# Patient Record
Sex: Male | Born: 1965 | Race: Black or African American | Hispanic: No | Marital: Married | Smoking: Current every day smoker
Health system: Southern US, Community
[De-identification: ages and names within clinical notes are randomized; demographics above are authoritative.]

## PROBLEM LIST (undated history)

## (undated) DIAGNOSIS — M1990 Unspecified osteoarthritis, unspecified site: Secondary | ICD-10-CM

## (undated) DIAGNOSIS — R7303 Prediabetes: Secondary | ICD-10-CM

## (undated) DIAGNOSIS — Z789 Other specified health status: Secondary | ICD-10-CM

## (undated) HISTORY — PX: TONSILLECTOMY: SUR1361

---

## 2018-04-09 DIAGNOSIS — Z6841 Body Mass Index (BMI) 40.0 and over, adult: Secondary | ICD-10-CM | POA: Diagnosis not present

## 2018-04-09 DIAGNOSIS — L03115 Cellulitis of right lower limb: Secondary | ICD-10-CM | POA: Diagnosis not present

## 2018-04-09 DIAGNOSIS — S81801A Unspecified open wound, right lower leg, initial encounter: Secondary | ICD-10-CM | POA: Diagnosis not present

## 2018-04-09 DIAGNOSIS — S91301A Unspecified open wound, right foot, initial encounter: Secondary | ICD-10-CM | POA: Diagnosis not present

## 2018-05-02 ENCOUNTER — Encounter: Payer: Self-pay | Admitting: Family

## 2018-05-02 ENCOUNTER — Ambulatory Visit (INDEPENDENT_AMBULATORY_CARE_PROVIDER_SITE_OTHER): Payer: BLUE CROSS/BLUE SHIELD | Admitting: Family

## 2018-05-02 VITALS — BP 140/99 | HR 78 | Temp 98.7°F | Ht 73.0 in | Wt 306.4 lb

## 2018-05-02 DIAGNOSIS — S81801D Unspecified open wound, right lower leg, subsequent encounter: Secondary | ICD-10-CM | POA: Diagnosis not present

## 2018-05-02 DIAGNOSIS — Z23 Encounter for immunization: Secondary | ICD-10-CM

## 2018-05-02 DIAGNOSIS — S81801A Unspecified open wound, right lower leg, initial encounter: Secondary | ICD-10-CM | POA: Diagnosis not present

## 2018-05-02 MED ORDER — DOXYCYCLINE HYCLATE 100 MG PO TABS: 100.0000 mg | ORAL_TABLET | Freq: Two times a day (BID) | ORAL | 0 refills | Status: DC

## 2018-05-02 NOTE — Progress Notes (Signed)
Subjective:    Patient ID: Mike Hodge, male    DOB: 10-28-1965, 52 y.o.   MRN: [id number]  Chief Complaint  Patient presents with  . New Patient (Initial Visit)    etabilish care, rechecklaceration on right leg   Pt presents to the office today to establish care. He states he had laceration about a month ago and would like this checked. He states he was walking up stairs and fell and hit the wood steps on his right lower leg. He went to the Urgent Care and was given Bactrim. He completed this medication. He states the wound is gradually improving with less swelling and pain. He cleans the leg daily with peroxide and states he a mild reddish/yellowish discharge.  Wound Check  He was originally treated more than 14 days ago. Previous treatment included oral antibiotics. His temperature was unmeasured prior to arrival. The redness has improved. The swelling has improved. He has no difficulty moving the affected extremity or digit.      Review of Systems  Skin: Positive for wound.  All other systems reviewed and are negative.  Breast Cancer-relatedfamily history is not on file.  Social History   Socioeconomic History  . Marital status: Married    Spouse name: Not on file  . Number of children: Not on file  . Years of education: Not on file  . Highest education level: Not on file  Occupational History  . Not on file  Social Needs  . Financial resource strain: Not on file  . Food insecurity:    Worry: Not on file    Inability: Not on file  . Transportation needs:    Medical: Not on file    Non-medical: Not on file  Tobacco Use  . Smoking status: Current Every Day Smoker    Types: Cigarettes  . Smokeless tobacco: Never Used  Substance and Sexual Activity  . Alcohol use: Yes    Alcohol/week: 5.0 standard drinks    Types: 5 Cans of beer per week    Comment: about 5 cans of beer weekly  . Drug use: Never  . Sexual activity: Yes  Lifestyle  . Physical activity:    Days  per week: Not on file    Minutes per session: Not on file  . Stress: Not on file  Relationships  . Social connections:    Talks on phone: Not on file    Gets together: Not on file    Attends religious service: Not on file    Active member of club or organization: Not on file    Attends meetings of clubs or organizations: Not on file    Relationship status: Not on file  Other Topics Concern  . Not on file  Social History Narrative  . Not on file       Objective:   Physical Exam  Constitutional: He is oriented to person, place, and time. He appears well-developed and well-nourished. No distress.  HENT:  Head: Normocephalic.  Right Ear: External ear normal.  Left Ear: External ear normal.  Mouth/Throat: Oropharynx is clear and moist.  Eyes: Pupils are equal, round, and reactive to light. Right eye exhibits no discharge. Left eye exhibits no discharge.  Neck: Normal range of motion. Neck supple. No thyromegaly present.  Cardiovascular: Normal rate, regular rhythm, normal heart sounds and intact distal pulses.  No murmur heard. Pulmonary/Chest: Effort normal and breath sounds normal. No respiratory distress. He has no wheezes.  Abdominal: Soft. Bowel sounds are  normal. He exhibits no distension. There is no tenderness.  Musculoskeletal: Normal range of motion. He exhibits no edema or tenderness.  Neurological: He is alert and oriented to person, place, and time. He has normal reflexes. No cranial nerve deficit.  Skin: Skin is warm and dry. No rash noted. No erythema.  2.5X1.8Cm and 0.5 cm deep  Psychiatric: He has a normal mood and affect. His behavior is normal. Judgment and thought content normal.  Vitals reviewed.     Wound cleaned, and wet to dry dressing applied.  BP (!) 140/99   Pulse 78   Temp 98.7 F (37.1 C) (Oral)   Ht 6\' 1"  (1.854 m)   Wt (!) 306 lb 6.4 oz (139 kg)   BMI 40.42 kg/m      Assessment & Plan:  Browning Southwood comes in today with chief complaint  of New Patient (Initial Visit) (etabilish care, rechecklaceration on right leg)   Diagnosis and orders addressed:  1. Open wound of right lower leg, subsequent encounter Will start Doxycycline After reviewing Urgent Care wound culture- Pt was suppose to continue Bactrim, but also start Omnicef. He reports he was not aware and never picked up rx.  Wound is still very deep and not healing. We will do referral to wound care.  Dressing change wet to dry daily RTO in 10 days for recheck if you do not have wound care appt set up - doxycycline (VIBRA-TABS) 100 MG tablet; Take 1 tablet (100 mg total) by mouth 2 (two) times daily.  Dispense: 20 tablet; Refill: 0 - Anaerobic and Aerobic Culture - AMB referral to wound care center     Jannifer Rodney, FNP

## 2018-05-02 NOTE — Patient Instructions (Signed)

## 2018-05-02 NOTE — Progress Notes (Signed)
a 

## 2018-05-06 LAB — ANAEROBIC AND AEROBIC CULTURE

## 2018-05-13 ENCOUNTER — Ambulatory Visit: Payer: BLUE CROSS/BLUE SHIELD | Admitting: Family

## 2018-05-17 ENCOUNTER — Ambulatory Visit (INDEPENDENT_AMBULATORY_CARE_PROVIDER_SITE_OTHER): Payer: BLUE CROSS/BLUE SHIELD | Admitting: Family

## 2018-05-17 ENCOUNTER — Encounter: Payer: Self-pay | Admitting: Family

## 2018-05-17 VITALS — BP 161/117 | HR 68 | Temp 97.2°F | Ht 73.0 in | Wt 307.6 lb

## 2018-05-17 DIAGNOSIS — Z5189 Encounter for other specified aftercare: Secondary | ICD-10-CM

## 2018-05-17 DIAGNOSIS — R030 Elevated blood-pressure reading, without diagnosis of hypertension: Secondary | ICD-10-CM

## 2018-05-17 NOTE — Patient Instructions (Signed)

## 2018-05-17 NOTE — Progress Notes (Signed)
   Subjective:    Patient ID: Mike Hodge, male    DOB: 11/09/1965, 52 y.o.   MRN: [id number]  Chief Complaint  Patient presents with  . Wound Check   PT presents to the office today for wound recheck. We saw each other on 05/02/18 for wound on right lower leg that he had fallen on some wooden steps around 09/01. He has completed his doxycycline and doing wet to dry dressing. He reports he feels like the wound has become smaller.   He has not followed up with wound care, because his mother has been in the hospital.   Wound Check  He was originally treated more than 14 days ago. Previous treatment included oral antibiotics. There has been no drainage from the wound. The redness has improved. The swelling has improved. There is no pain present. He has no difficulty moving the affected extremity or digit.      Review of Systems  All other systems reviewed and are negative.      Objective:   Physical Exam  Constitutional: He is oriented to person, place, and time. He appears well-developed and well-nourished. No distress.  HENT:  Head: Normocephalic.  Eyes: Pupils are equal, round, and reactive to light. Right eye exhibits no discharge. Left eye exhibits no discharge.  Neck: Normal range of motion. Neck supple. No thyromegaly present.  Cardiovascular: Normal rate, regular rhythm, normal heart sounds and intact distal pulses.  No murmur heard. Pulmonary/Chest: Effort normal and breath sounds normal. No respiratory distress. He has no wheezes.  Abdominal: Soft. Bowel sounds are normal. He exhibits no distension. There is no tenderness.  Musculoskeletal: Normal range of motion. He exhibits no edema or tenderness.  Neurological: He is alert and oriented to person, place, and time. He has normal reflexes. No cranial nerve deficit.  Skin: Skin is warm and dry. No rash noted. There is erythema.  Skin wound on right lower leg approx 1.5X1.4cm and 0.5 cm deep  Psychiatric: He has a normal  mood and affect. His behavior is normal. Judgment and thought content normal.  Vitals reviewed.       BP (!) 161/117   Pulse 68   Temp (!) 97.2 F (36.2 C) (Oral)   Ht 6\' 1"  (1.854 m)   Wt (!) 307 lb 9.6 oz (139.5 kg)   BMI 40.58 kg/m      Assessment & Plan:  [name] comes in today with chief complaint of Wound Check   Diagnosis and orders addressed:  1. Encounter for wound care Wound as improved in size and erythemas No s/s of infection at this time, will hold off on antibiotics at this time. Discussed importance of calling or RTO if wound has increased redness, swelling, fever, or discharge He will call wound care, number given to patient  2. Elevated blood pressure reading Last two visits his BP has been elevated He does not wish to start medications  Low salt diet RTO in 4 weeks to recheck and if still elevated will start medication  2  Follow up plan: 4 weeks    Jannifer Rodney, FNP

## 2018-06-14 ENCOUNTER — Ambulatory Visit: Payer: BLUE CROSS/BLUE SHIELD | Admitting: Family

## 2019-07-16 DIAGNOSIS — R05 Cough: Secondary | ICD-10-CM | POA: Diagnosis not present

## 2019-07-16 DIAGNOSIS — Z20828 Contact with and (suspected) exposure to other viral communicable diseases: Secondary | ICD-10-CM | POA: Diagnosis not present

## 2019-07-16 DIAGNOSIS — R0602 Shortness of breath: Secondary | ICD-10-CM | POA: Diagnosis not present

## 2021-01-13 DIAGNOSIS — M542 Cervicalgia: Secondary | ICD-10-CM | POA: Diagnosis not present

## 2021-01-14 ENCOUNTER — Other Ambulatory Visit: Payer: Self-pay | Admitting: Orthopedic Surgery

## 2021-01-14 DIAGNOSIS — M542 Cervicalgia: Secondary | ICD-10-CM

## 2021-01-18 ENCOUNTER — Ambulatory Visit
Admission: RE | Admit: 2021-01-18 | Discharge: 2021-01-18 | Disposition: A | Discharge status: Home / Self Care | Payer: BC Managed Care – PPO | Source: Ambulatory Visit | Attending: Orthopedic Surgery | Admitting: Orthopedic Surgery

## 2021-01-18 ENCOUNTER — Other Ambulatory Visit: Payer: Self-pay

## 2021-01-18 DIAGNOSIS — M4802 Spinal stenosis, cervical region: Secondary | ICD-10-CM | POA: Diagnosis not present

## 2021-01-18 DIAGNOSIS — M542 Cervicalgia: Secondary | ICD-10-CM

## 2021-01-20 DIAGNOSIS — G9520 Unspecified cord compression: Secondary | ICD-10-CM | POA: Diagnosis not present

## 2021-02-01 ENCOUNTER — Other Ambulatory Visit: Payer: Self-pay | Admitting: Orthopedic Surgery

## 2021-02-01 ENCOUNTER — Other Ambulatory Visit: Payer: Self-pay | Admitting: Neurosurgery

## 2021-02-01 DIAGNOSIS — M542 Cervicalgia: Secondary | ICD-10-CM

## 2021-02-11 NOTE — Pre-Procedure Instructions (Addendum)
Jaxtin Raimondo  02/11/2021      Your procedure is scheduled on Thursday, August 4.  Report to [facility], Main Entrance or Entrance "A" at 10:30 AM .                Your surgery or procedure is scheduled to begin at 12:30 AM   Call this number if you have problems the morning of surgery: [phone]  This is the number for the Pre- Surgical Desk.                For any other questions, please call [phone], Monday - Friday 8 AM - 4 PM.   Remember:  Do not eat after midnight Wednesday, 02/16/21  You may drink clear liquids until 9:30 AM Clear liquids are: Water, Tea, Coffee with no cream or no dairy creamer- you may use sugar or sweater, Soda, Gatorade, Jello without fruit, Plain Popsicle with fruit.    Take these medicines the morning of surgery with A SIP OF WATER : Tylenol if needed  STOP taking Aspirin, Aspirin Products (Goody Powder, Excedrin Migraine), Ibuprofen (Advil), Naproxen (Aleve), Vitamins and Herbal Products (ie Fish Oil).    Special instructions:    [facility]- Preparing For Surgery  Before surgery, you can play an important role. Because skin is not sterile, your skin needs to be as free of germs as possible. You can reduce the number of germs on your skin by washing with CHG (chlorahexidine gluconate) Soap before surgery.  CHG is an antiseptic cleaner which kills germs and bonds with the skin to continue killing germs even after washing.    Oral Hygiene is also important to reduce your risk of infection.  Remember - BRUSH YOUR TEETH THE MORNING OF SURGERY WITH YOUR REGULAR TOOTHPASTE  Please do not use if you have an allergy to CHG or antibacterial soaps. If your skin becomes reddened/irritated stop using the CHG.  Do not shave (including legs and underarms) for at least 48 hours prior to first CHG shower. It is OK to shave your face.  Please follow these instructions carefully.   Shower the NIGHT BEFORE SURGERY and the MORNING OF SURGERY with CHG.    If you chose to wash your hair, wash your hair first as usual with your normal shampoo.  After you shampoo, wash your face and private area with the soap you use at home, then rinse your hair and body thoroughly to remove the shampoo and soap.  Use CHG as you would any other liquid soap. You can apply CHG directly to the skin and wash gently with a scrungie or a clean washcloth.   Apply the CHG Soap to your body ONLY FROM THE NECK DOWN.  Do not use on open wounds or open sores. Avoid contact with your eyes, ears, mouth and genitals (private parts).   Wash thoroughly, paying special attention to the area where your surgery will be performed.  Thoroughly rinse your body with warm water from the neck down.  DO NOT shower/wash with your normal soap after using and rinsing off the CHG Soap.  Pat yourself dry with a CLEAN TOWEL.  Wear CLEAN PAJAMAS to bed the night before surgery, wear comfortable clothes the morning of surgery  Place CLEAN SHEETS on your bed the night of your first shower and DO NOT SLEEP WITH PETS.  Day of Surgery: Shower as instructed above. Do not apply any deodorants/lotions, powders or colognes.  Please wear clean clothes  to the hospital/surgery center.   Remember to brush your teeth WITH YOUR REGULAR TOOTHPASTE.  Do not wear jewelry, make-up or nail polish.  Do not shave 48 hours prior to surgery.  Men may shave face and neck.  Do not bring valuables to the hospital.  [facility] is not responsible for any belongings or valuables.  Contacts, dentures or bridgework may not be worn into surgery.  Leave your suitcase in the car.  After surgery it may be brought to your room.  For patients admitted to the hospital, discharge time will be determined by your treatment team.  Patients discharged the day of surgery will not be allowed to drive home.   Please read over the fact sheets that you were given.

## 2021-02-14 ENCOUNTER — Other Ambulatory Visit: Payer: Self-pay

## 2021-02-14 ENCOUNTER — Encounter (HOSPITAL_COMMUNITY)
Admission: RE | Admit: 2021-02-14 | Discharge: 2021-02-14 | Disposition: A | Discharge status: Home / Self Care | Payer: BC Managed Care – PPO | Source: Ambulatory Visit | Attending: Neurosurgery | Admitting: Neurosurgery

## 2021-02-14 ENCOUNTER — Encounter (HOSPITAL_COMMUNITY): Payer: Self-pay

## 2021-02-14 DIAGNOSIS — U071 COVID-19: Secondary | ICD-10-CM | POA: Insufficient documentation

## 2021-02-14 DIAGNOSIS — Z01812 Encounter for preprocedural laboratory examination: Secondary | ICD-10-CM | POA: Diagnosis not present

## 2021-02-14 HISTORY — DX: Other specified health status: Z78.9

## 2021-02-14 LAB — CBC
HCT: 51.9 % (ref 39.0–52.0)
Hemoglobin: 17.1 g/dL — ABNORMAL HIGH (ref 13.0–17.0)
MCH: 29.9 pg (ref 26.0–34.0)
MCHC: 32.9 g/dL (ref 30.0–36.0)
MCV: 90.7 fL (ref 80.0–100.0)
Platelets: 240 10*3/uL (ref 150–400)
RBC: 5.72 MIL/uL (ref 4.22–5.81)
RDW: 12 % (ref 11.5–15.5)
WBC: 6.4 10*3/uL (ref 4.0–10.5)
nRBC: 0 % (ref 0.0–0.2)

## 2021-02-14 LAB — SURGICAL PCR SCREEN
MRSA, PCR: NEGATIVE
Staphylococcus aureus: NEGATIVE

## 2021-02-14 LAB — BASIC METABOLIC PANEL
Anion gap: 8 (ref 5–15)
BUN: 12 mg/dL (ref 6–20)
CO2: 25 mmol/L (ref 22–32)
Calcium: 9.1 mg/dL (ref 8.9–10.3)
Chloride: 105 mmol/L (ref 98–111)
Creatinine, Ser: 0.94 mg/dL (ref 0.61–1.24)
GFR, Estimated: >60 mL/min (ref >60)
Glucose, Bld: 145 mg/dL — ABNORMAL HIGH (ref 70–99)
Potassium: 4.2 mmol/L (ref 3.5–5.1)
Sodium: 138 mmol/L (ref 135–145)

## 2021-02-14 LAB — TYPE AND SCREEN
ABO/RH(D): O POS
Antibody Screen: NEGATIVE

## 2021-02-14 LAB — SARS CORONAVIRUS 2 (TAT 6-24 HRS): SARS Coronavirus 2: POSITIVE — AB

## 2021-02-14 NOTE — Progress Notes (Addendum)
PCP - Has been seen at [facility]- Has a physical for DOT yearly.  Cardiologist - no   Chest x-ray - na  EKG - 02/14/21  Stress Test - no  ECHO - none  Cardiac Cath - none   Sleep Study - yes, yearly at [employer] CPAP - no  LABS- CBC, BMP, T/S, PCR, Covid  ASA-no  ERAS-yes  HA1C-na Fasting Blood Sugar - NA Checks Blood Sugar __0___ times a day Mike Hodge was positive for Covid. I left a message on Mike Hodge's voice mail with positive Covid result. I called Mike Hodge and notified him of positive Covid test. Anesthesia-  Pt denies having chest pain, sob, or fever at this time. All instructions explained to the pt, with a verbal understanding of the material. Pt agrees to go over the instructions while at home for a better understanding. Pt also instructed to self quarantine after being tested for COVID-19. The opportunity to ask questions was provided.

## 2021-03-03 ENCOUNTER — Other Ambulatory Visit: Payer: Self-pay

## 2021-03-03 ENCOUNTER — Encounter (HOSPITAL_COMMUNITY): Payer: Self-pay | Admitting: Neurosurgery

## 2021-03-03 NOTE — Progress Notes (Signed)
Spoke with pt about surgery change to 03/04/2021 at 1024. Pt needs to be here at 0800. NPO after midnight clear liquids until 0730 am on 03/04/2021. Pt to follow shower instructions with soap on instruction sheet from 02/14/21.    Pt was covid positive on 02/14/2021

## 2021-03-04 ENCOUNTER — Ambulatory Visit (HOSPITAL_COMMUNITY): Payer: BC Managed Care – PPO

## 2021-03-04 ENCOUNTER — Ambulatory Visit (HOSPITAL_COMMUNITY): Payer: BC Managed Care – PPO | Admitting: Certified Registered Nurse Anesthetist

## 2021-03-04 ENCOUNTER — Encounter (HOSPITAL_COMMUNITY): Payer: Self-pay | Admitting: Neurosurgery

## 2021-03-04 ENCOUNTER — Other Ambulatory Visit: Payer: Self-pay

## 2021-03-04 ENCOUNTER — Observation Stay (HOSPITAL_COMMUNITY)
Admission: RE | Admit: 2021-03-04 | Discharge: 2021-03-05 | Disposition: A | Discharge status: Home / Self Care | Payer: BC Managed Care – PPO | Attending: Neurosurgery | Admitting: Neurosurgery

## 2021-03-04 ENCOUNTER — Encounter (HOSPITAL_COMMUNITY)
Admission: RE | Disposition: A | Discharge status: Home / Self Care | Payer: Self-pay | Source: Home / Self Care | Attending: Neurosurgery

## 2021-03-04 ENCOUNTER — Ambulatory Visit (HOSPITAL_COMMUNITY): Payer: BC Managed Care – PPO | Admitting: Physician Assistant

## 2021-03-04 DIAGNOSIS — R200 Anesthesia of skin: Secondary | ICD-10-CM | POA: Diagnosis not present

## 2021-03-04 DIAGNOSIS — G9520 Unspecified cord compression: Secondary | ICD-10-CM | POA: Diagnosis not present

## 2021-03-04 DIAGNOSIS — G9529 Other cord compression: Principal | ICD-10-CM | POA: Insufficient documentation

## 2021-03-04 DIAGNOSIS — Z981 Arthrodesis status: Secondary | ICD-10-CM | POA: Diagnosis not present

## 2021-03-04 DIAGNOSIS — M4802 Spinal stenosis, cervical region: Secondary | ICD-10-CM | POA: Diagnosis not present

## 2021-03-04 DIAGNOSIS — F17200 Nicotine dependence, unspecified, uncomplicated: Secondary | ICD-10-CM | POA: Diagnosis not present

## 2021-03-04 DIAGNOSIS — Z419 Encounter for procedure for purposes other than remedying health state, unspecified: Secondary | ICD-10-CM

## 2021-03-04 HISTORY — PX: ANTERIOR CERVICAL CORPECTOMY: SHX1159

## 2021-03-04 LAB — TYPE AND SCREEN
ABO/RH(D): O POS
Antibody Screen: NEGATIVE

## 2021-03-04 SURGERY — ANTERIOR CERVICAL CORPECTOMY
Anesthesia: General

## 2021-03-04 MED ORDER — HYDROMORPHONE HCL 1 MG/ML IJ SOLN
0.2500 mg | INTRAMUSCULAR | Status: DC | PRN
  Administered 2021-03-04 (×2): 0.5 mg via INTRAVENOUS

## 2021-03-04 MED ORDER — LACTATED RINGERS IV SOLN: INTRAVENOUS | Status: DC

## 2021-03-04 MED ORDER — CHLORHEXIDINE GLUCONATE CLOTH 2 % EX PADS: 6.0000 | MEDICATED_PAD | Freq: Once | CUTANEOUS | Status: DC

## 2021-03-04 MED ORDER — ROCURONIUM BROMIDE 10 MG/ML (PF) SYRINGE
PREFILLED_SYRINGE | INTRAVENOUS | Status: DC | PRN
  Administered 2021-03-04: 50 mg via INTRAVENOUS
  Administered 2021-03-04: 30 mg via INTRAVENOUS
  Administered 2021-03-04: 70 mg via INTRAVENOUS
  Administered 2021-03-04: 20 mg via INTRAVENOUS

## 2021-03-04 MED ORDER — ONDANSETRON HCL 4 MG/2ML IJ SOLN: 4.0000 mg | Freq: Four times a day (QID) | INTRAMUSCULAR | Status: DC | PRN

## 2021-03-04 MED ORDER — HYDROCODONE-ACETAMINOPHEN 7.5-325 MG PO TABS
2.0000 | ORAL_TABLET | ORAL | Status: DC | PRN
  Filled 2021-03-04: qty 2

## 2021-03-04 MED ORDER — LIDOCAINE 2% (20 MG/ML) 5 ML SYRINGE
INTRAMUSCULAR | Status: DC | PRN
  Administered 2021-03-04: 60 mg via INTRAVENOUS

## 2021-03-04 MED ORDER — POTASSIUM CHLORIDE IN NACL 20-0.9 MEQ/L-% IV SOLN: INTRAVENOUS | Status: DC

## 2021-03-04 MED ORDER — SODIUM CHLORIDE 0.9% FLUSH: 3.0000 mL | Freq: Two times a day (BID) | INTRAVENOUS | Status: DC

## 2021-03-04 MED ORDER — SODIUM CHLORIDE 0.9 % IV SOLN
250.0000 mL | INTRAVENOUS | Status: DC
  Administered 2021-03-04: 250 mL via INTRAVENOUS

## 2021-03-04 MED ORDER — MIDAZOLAM HCL 2 MG/2ML IJ SOLN
INTRAMUSCULAR | Status: DC | PRN
  Administered 2021-03-04: 2 mg via INTRAVENOUS

## 2021-03-04 MED ORDER — ORAL CARE MOUTH RINSE: 15.0000 mL | Freq: Once | OROMUCOSAL | Status: AC

## 2021-03-04 MED ORDER — FENTANYL CITRATE (PF) 250 MCG/5ML IJ SOLN
INTRAMUSCULAR | Status: DC | PRN
  Administered 2021-03-04 (×3): 50 ug via INTRAVENOUS
  Administered 2021-03-04: 100 ug via INTRAVENOUS

## 2021-03-04 MED ORDER — ACETAMINOPHEN 650 MG RE SUPP: 650.0000 mg | RECTAL | Status: DC | PRN

## 2021-03-04 MED ORDER — DEXAMETHASONE SODIUM PHOSPHATE 10 MG/ML IJ SOLN
INTRAMUSCULAR | Status: DC | PRN
  Administered 2021-03-04: 10 mg via INTRAVENOUS

## 2021-03-04 MED ORDER — THROMBIN 20000 UNITS EX KIT
PACK | CUTANEOUS | Status: DC | PRN
  Administered 2021-03-04: 20 mL via TOPICAL

## 2021-03-04 MED ORDER — MIDAZOLAM HCL 2 MG/2ML IJ SOLN
INTRAMUSCULAR | Status: AC
  Filled 2021-03-04: qty 2

## 2021-03-04 MED ORDER — CEFAZOLIN IN SODIUM CHLORIDE 3-0.9 GM/100ML-% IV SOLN
3.0000 g | INTRAVENOUS | Status: AC
Start: 2021-03-04 — End: 2021-03-04
  Administered 2021-03-04: 3 g via INTRAVENOUS
  Filled 2021-03-04: qty 100

## 2021-03-04 MED ORDER — PROPOFOL 10 MG/ML IV BOLUS
INTRAVENOUS | Status: AC
  Filled 2021-03-04: qty 20

## 2021-03-04 MED ORDER — PHENOL 1.4 % MT LIQD: 1.0000 | OROMUCOSAL | Status: DC | PRN

## 2021-03-04 MED ORDER — CHLORHEXIDINE GLUCONATE 0.12 % MT SOLN
15.0000 mL | Freq: Once | OROMUCOSAL | Status: AC
  Administered 2021-03-04: 15 mL via OROMUCOSAL
  Filled 2021-03-04: qty 15

## 2021-03-04 MED ORDER — DEXAMETHASONE SODIUM PHOSPHATE 4 MG/ML IJ SOLN
4.0000 mg | Freq: Four times a day (QID) | INTRAMUSCULAR | Status: DC
  Administered 2021-03-04 – 2021-03-05 (×3): 4 mg via INTRAVENOUS
  Filled 2021-03-04 (×3): qty 1

## 2021-03-04 MED ORDER — MENTHOL 3 MG MT LOZG: 1.0000 | LOZENGE | OROMUCOSAL | Status: DC | PRN

## 2021-03-04 MED ORDER — SODIUM CHLORIDE 0.9% FLUSH: 3.0000 mL | INTRAVENOUS | Status: DC | PRN

## 2021-03-04 MED ORDER — ACETAMINOPHEN 325 MG PO TABS
650.0000 mg | ORAL_TABLET | ORAL | Status: DC | PRN
  Administered 2021-03-04: 650 mg via ORAL
  Filled 2021-03-04: qty 2

## 2021-03-04 MED ORDER — MORPHINE SULFATE (PF) 2 MG/ML IV SOLN: 2.0000 mg | INTRAVENOUS | Status: DC | PRN

## 2021-03-04 MED ORDER — ALBUMIN HUMAN 5 % IV SOLN: INTRAVENOUS | Status: DC | PRN

## 2021-03-04 MED ORDER — CELECOXIB 200 MG PO CAPS
200.0000 mg | ORAL_CAPSULE | Freq: Two times a day (BID) | ORAL | Status: DC
  Administered 2021-03-04: 200 mg via ORAL
  Filled 2021-03-04: qty 1

## 2021-03-04 MED ORDER — OXYCODONE HCL ER 10 MG PO T12A
10.0000 mg | EXTENDED_RELEASE_TABLET | Freq: Two times a day (BID) | ORAL | Status: DC
Start: 2021-03-04 — End: 2021-03-05
  Filled 2021-03-04: qty 1

## 2021-03-04 MED ORDER — FENTANYL CITRATE (PF) 250 MCG/5ML IJ SOLN
INTRAMUSCULAR | Status: AC
  Filled 2021-03-04: qty 5

## 2021-03-04 MED ORDER — DIAZEPAM 5 MG PO TABS: 5.0000 mg | ORAL_TABLET | Freq: Four times a day (QID) | ORAL | Status: DC | PRN

## 2021-03-04 MED ORDER — HYDROMORPHONE HCL 1 MG/ML IJ SOLN
INTRAMUSCULAR | Status: AC
  Filled 2021-03-04: qty 1

## 2021-03-04 MED ORDER — PROPOFOL 10 MG/ML IV BOLUS
INTRAVENOUS | Status: DC | PRN
  Administered 2021-03-04: 50 mg via INTRAVENOUS
  Administered 2021-03-04: 150 mg via INTRAVENOUS

## 2021-03-04 MED ORDER — DEXAMETHASONE 4 MG PO TABS: 4.0000 mg | ORAL_TABLET | Freq: Four times a day (QID) | ORAL | Status: DC

## 2021-03-04 MED ORDER — THROMBIN 20000 UNITS EX SOLR
CUTANEOUS | Status: AC
  Filled 2021-03-04: qty 20000

## 2021-03-04 MED ORDER — ACETAMINOPHEN 500 MG PO TABS
1000.0000 mg | ORAL_TABLET | Freq: Once | ORAL | Status: AC
  Administered 2021-03-04: 1000 mg via ORAL
  Filled 2021-03-04: qty 2

## 2021-03-04 MED ORDER — HYDROCODONE-ACETAMINOPHEN 7.5-325 MG PO TABS: 1.0000 | ORAL_TABLET | ORAL | Status: DC | PRN

## 2021-03-04 MED ORDER — PHENYLEPHRINE HCL-NACL 20-0.9 MG/250ML-% IV SOLN
INTRAVENOUS | Status: DC | PRN
  Administered 2021-03-04: 20 ug/min via INTRAVENOUS

## 2021-03-04 MED ORDER — LIDOCAINE-EPINEPHRINE 1 %-1:100000 IJ SOLN
INTRAMUSCULAR | Status: DC | PRN
  Administered 2021-03-04: 5 mL

## 2021-03-04 MED ORDER — SUGAMMADEX SODIUM 200 MG/2ML IV SOLN
INTRAVENOUS | Status: DC | PRN
  Administered 2021-03-04: 400 mg via INTRAVENOUS

## 2021-03-04 MED ORDER — ZOLPIDEM TARTRATE 5 MG PO TABS: 5.0000 mg | ORAL_TABLET | Freq: Every evening | ORAL | Status: DC | PRN

## 2021-03-04 MED ORDER — LIDOCAINE-EPINEPHRINE 1 %-1:100000 IJ SOLN
INTRAMUSCULAR | Status: AC
  Filled 2021-03-04: qty 1

## 2021-03-04 MED ORDER — ONDANSETRON HCL 4 MG/2ML IJ SOLN
INTRAMUSCULAR | Status: DC | PRN
  Administered 2021-03-04: 4 mg via INTRAVENOUS

## 2021-03-04 MED ORDER — 0.9 % SODIUM CHLORIDE (POUR BTL) OPTIME
TOPICAL | Status: DC | PRN
  Administered 2021-03-04: 1000 mL

## 2021-03-04 MED ORDER — ONDANSETRON HCL 4 MG PO TABS: 4.0000 mg | ORAL_TABLET | Freq: Four times a day (QID) | ORAL | Status: DC | PRN

## 2021-03-04 SURGICAL SUPPLY — 55 items
BAG COUNTER SPONGE SURGICOUNT (BAG) ×4 IMPLANT
BAND RUBBER #18 3X1/16 STRL (MISCELLANEOUS) ×4 IMPLANT
BASKET BONE COLLECTION (BASKET) ×2 IMPLANT
BLADE CLIPPER SURG (BLADE) IMPLANT
BUR DRUM 4.0 (BURR) ×2 IMPLANT
BUR MATCHSTICK NEURO 3.0 LAGG (BURR) ×2 IMPLANT
BUR ROUND FLUTED 4 SOFT TCH (BURR) ×2 IMPLANT
CAGE CORP CAPRI 12X14X24 7D (Cage) IMPLANT
CAGE CORP CAPRI 12X14X26 7D (Cage) ×2 IMPLANT
CANISTER SUCT 3000ML PPV (MISCELLANEOUS) ×2 IMPLANT
CARTRIDGE OIL MAESTRO DRILL (MISCELLANEOUS) ×1 IMPLANT
DECANTER SPIKE VIAL GLASS SM (MISCELLANEOUS) ×2 IMPLANT
DERMABOND ADVANCED (GAUZE/BANDAGES/DRESSINGS) ×1
DERMABOND ADVANCED .7 DNX12 (GAUZE/BANDAGES/DRESSINGS) ×1 IMPLANT
DIFFUSER DRILL AIR PNEUMATIC (MISCELLANEOUS) ×2 IMPLANT
DRAPE HALF SHEET 40X57 (DRAPES) IMPLANT
DRAPE LAPAROTOMY 100X72 PEDS (DRAPES) ×2 IMPLANT
DRAPE MICROSCOPE LEICA (MISCELLANEOUS) ×2 IMPLANT
DRILL BIT 14MM (BIT) ×2 IMPLANT
DURAPREP 6ML APPLICATOR 50/CS (WOUND CARE) ×2 IMPLANT
ELECT COATED BLADE 2.86 ST (ELECTRODE) ×2 IMPLANT
ELECT REM PT RETURN 9FT ADLT (ELECTROSURGICAL) ×2
ELECTRODE REM PT RTRN 9FT ADLT (ELECTROSURGICAL) ×1 IMPLANT
GAUZE 4X4 16PLY ~~LOC~~+RFID DBL (SPONGE) ×2 IMPLANT
GLOVE EXAM NITRILE XL STR (GLOVE) IMPLANT
GLOVE SURG LTX SZ6.5 (GLOVE) ×2 IMPLANT
GLOVE SURG LTX SZ7 (GLOVE) ×2 IMPLANT
GLOVE SURG UNDER POLY LF SZ7 (GLOVE) ×4 IMPLANT
GLOVE SURG UNDER POLY LF SZ7.5 (GLOVE) ×6 IMPLANT
GOWN STRL REUS W/ TWL LRG LVL3 (GOWN DISPOSABLE) ×2 IMPLANT
GOWN STRL REUS W/ TWL XL LVL3 (GOWN DISPOSABLE) ×2 IMPLANT
GOWN STRL REUS W/TWL 2XL LVL3 (GOWN DISPOSABLE) IMPLANT
GOWN STRL REUS W/TWL LRG LVL3 (GOWN DISPOSABLE) ×2
GOWN STRL REUS W/TWL XL LVL3 (GOWN DISPOSABLE) ×2
HALTER HD/CHIN CERV TRACTION D (MISCELLANEOUS) IMPLANT
HEMOSTAT SURGICEL 2X14 (HEMOSTASIS) IMPLANT
KIT BASIN OR (CUSTOM PROCEDURE TRAY) ×2 IMPLANT
KIT TURNOVER KIT B (KITS) ×2 IMPLANT
NEEDLE HYPO 25X1 1.5 SAFETY (NEEDLE) ×2 IMPLANT
NEEDLE SPNL 22GX3.5 QUINCKE BK (NEEDLE) ×6 IMPLANT
NS IRRIG 1000ML POUR BTL (IV SOLUTION) ×2 IMPLANT
OIL CARTRIDGE MAESTRO DRILL (MISCELLANEOUS) ×2
PACK LAMINECTOMY NEURO (CUSTOM PROCEDURE TRAY) ×2 IMPLANT
PAD ARMBOARD 7.5X6 YLW CONV (MISCELLANEOUS) ×2 IMPLANT
PIN DISTRACTION 14MM (PIN) ×2 IMPLANT
PLATE T HELIX 42 2L (Plate) ×2 IMPLANT
SCREW HELIX T SELF TAP 15MM (Screw) ×8 IMPLANT
SPONGE INTESTINAL PEANUT (DISPOSABLE) ×2 IMPLANT
SPONGE SURGIFOAM ABS GEL 100 (HEMOSTASIS) ×2 IMPLANT
SUT VIC AB 0 CT1 27 (SUTURE) ×1
SUT VIC AB 0 CT1 27XBRD ANTBC (SUTURE) ×1 IMPLANT
SUT VIC AB 3-0 SH 8-18 (SUTURE) ×6 IMPLANT
TOWEL GREEN STERILE (TOWEL DISPOSABLE) ×2 IMPLANT
TOWEL GREEN STERILE FF (TOWEL DISPOSABLE) ×2 IMPLANT
WATER STERILE IRR 1000ML POUR (IV SOLUTION) ×2 IMPLANT

## 2021-03-04 NOTE — Anesthesia Procedure Notes (Signed)
Procedure Name: Intubation Date/Time: 03/04/2021 12:10 PM Performed by: Lelon Perla, CRNA Pre-anesthesia Checklist: Patient identified, Emergency Drugs available, Suction available and Patient being monitored Patient Re-evaluated:Patient Re-evaluated prior to induction Oxygen Delivery Method: Circle system utilized Preoxygenation: Pre-oxygenation with 100% oxygen Induction Type: IV induction Ventilation: Mask ventilation without difficulty and Oral airway inserted - appropriate to patient size Laryngoscope Size: Glidescope and 4 Grade View: Grade I Tube type: Oral Tube size: 7.5 mm Number of attempts: 1 Airway Equipment and Method: Stylet and Oral airway Placement Confirmation: ETT inserted through vocal cords under direct vision, positive ETCO2 and breath sounds checked- equal and bilateral Secured at: 22 cm Tube secured with: Tape Dental Injury: Teeth and Oropharynx as per pre-operative assessment

## 2021-03-04 NOTE — Anesthesia Postprocedure Evaluation (Signed)
Anesthesia Post Note  Patient: Chancellor Vanderloop  Procedure(s) Performed: Cervical five Corpectomy     Patient location during evaluation: PACU Anesthesia Type: General Level of consciousness: awake and alert Pain management: pain level controlled Vital Signs Assessment: post-procedure vital signs reviewed and stable Respiratory status: spontaneous breathing, nonlabored ventilation, respiratory function stable and patient connected to nasal cannula oxygen Cardiovascular status: blood pressure returned to baseline and stable Postop Assessment: no apparent nausea or vomiting Anesthetic complications: no   No notable events documented.  Last Vitals:  Vitals:   03/04/21 1539 03/04/21 1554  BP: (!) 142/90 (!) 140/101  Pulse: 82 77  Resp: 16 (!) 25  Temp:    SpO2: 95% 96%    Last Pain:  Vitals:   03/04/21 1524  TempSrc:   PainSc: 10-Worst pain ever                 Lawan Nanez,W. EDMOND

## 2021-03-04 NOTE — Anesthesia Preprocedure Evaluation (Addendum)
Anesthesia Evaluation  Patient identified by MRN, date of birth, ID band Patient awake    Reviewed: Allergy & Precautions, H&P , NPO status , Patient's Chart, lab work & pertinent test results  Airway Mallampati: II  TM Distance: >3 FB Neck ROM: Full    Dental no notable dental hx. (+) Poor Dentition, Dental Advisory Given   Pulmonary Current Smoker and Patient abstained from smoking.,    Pulmonary exam normal breath sounds clear to auscultation       Cardiovascular negative cardio ROS   Rhythm:Regular Rate:Normal     Neuro/Psych negative neurological ROS  negative psych ROS   GI/Hepatic negative GI ROS, Neg liver ROS,   Endo/Other  Morbid obesity  Renal/GU negative Renal ROS  negative genitourinary   Musculoskeletal   Abdominal   Peds  Hematology negative hematology ROS (+)   Anesthesia Other Findings   Reproductive/Obstetrics negative OB ROS                            Anesthesia Physical Anesthesia Plan  ASA: 2  Anesthesia Plan: General   Post-op Pain Management:    Induction: Intravenous  PONV Risk Score and Plan: 2 and Ondansetron, Dexamethasone and Midazolam  Airway Management Planned: Oral ETT and Video Laryngoscope Planned  Additional Equipment:   Intra-op Plan:   Post-operative Plan: Extubation in OR  Informed Consent: I have reviewed the patients History and Physical, chart, labs and discussed the procedure including the risks, benefits and alternatives for the proposed anesthesia with the patient or authorized representative who has indicated his/her understanding and acceptance.     Dental advisory given  Plan Discussed with: CRNA  Anesthesia Plan Comments:         Anesthesia Quick Evaluation

## 2021-03-04 NOTE — Transfer of Care (Signed)
Immediate Anesthesia Transfer of Care Note  Patient: Mike Hodge  Procedure(s) Performed: Cervical five Corpectomy  Patient Location: PACU  Anesthesia Type:General  Level of Consciousness: awake, alert  and oriented  Airway & Oxygen Therapy: Patient Spontanous Breathing and Patient connected to face mask oxygen  Post-op Assessment: Report given to RN and Post -op Vital signs reviewed and stable  Post vital signs: Reviewed and stable  Last Vitals:  Vitals Value Taken Time  BP 160/99 03/04/21 1524  Temp    Pulse 79 03/04/21 1526  Resp 17 03/04/21 1526  SpO2 98 % 03/04/21 1526  Vitals shown include unvalidated device data.  Last Pain:  Vitals:   03/04/21 0843  TempSrc:   PainSc: 0-No pain         Complications: No notable events documented.

## 2021-03-04 NOTE — H&P (Signed)
BP (!) 152/97   Pulse 73   Temp 98.7 F (37.1 C) (Oral)   Resp 17   Ht 6' (1.829 m)   Wt 135.6 kg   SpO2 99%   BMI 40.55 kg/m  [name] comes in today for evaluation of problems with coordination, problems with his gait, bouncing so to speak when he walks, and problems with his job.  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  Mike Hodge states he had no real pain and no other symptoms prior to approximately 3 weeks ago.  But, when it came on it came on suddenly and he has gotten fairly bad very quickly.  Mike Hodge reports numbness in his right arm and hand, sometimes numbness in his left hand.  He has no significant pain.  He says that walking will sometimes make the right arm feel more numb.  He states that there was an injury on 01/01/2021.  He works at [employer], again as a Hospital doctor.     MEDICATIONS :  He is taking prednisone and methocarbamol.     PAST SURGICAL HISTORY :  He has had no other surgeries.     ALLERGIES :  He has no known drug allergies.     REVIEW OF SYSTEMS :  Positive for arm and leg swelling, generalized weakness, numbness and tingling.  He has undergone an MRI.  No physical therapy.  He does feel weak in his legs.  No bowel or bladder dysfunction.     PAST MEDICAL HISTORY :  Otherwise good.     FAMILY HISTORY :  No hereditary diseases within the family.     SOCIAL HISTORY :  He is married.  He does have children.  He is right-handed.  He does use alcohol.  He has used tobacco.  He is 55 years of age.     PHYSICAL EXAMINATION :  Mike Hodge has a spastic gait.  He has a mildly positive Romberg exam.  He has positive Hoffmann, though not overwhelmingly positive, but they are there.  Reflexes are 2+ in biceps, triceps, brachioradialis, knees, and ankles.  Proprioception is intact.  Great difficulty trying to walk a straight line.  He has 5/5 strength in both the upper and lower  extremities.  Normal muscle tone and bulk.     He weighs 303 pounds.  Temperature is 98.2, blood pressure 151/104, pulse is 98.8.  Pain is 4/10.     IMAGING :  MRI was reviewed and this scan was performed on January 18, 2021.  What it shows is severe stenosis present at C5-6 and at C4-5.  Cord signal is present surrounding the C5-6 disc space.  He has what is seemingly either an osteophyte or some ossification on the left at C4-5, but there is certainly distortion of the cord indenting the cord, and then at C5-6 he has kind of a 3-humped appearance.  Foraminal narrowing present on the right side, not so much on the left, but there is severe cord pressure.  Paraspinous soft tissues look normal.  The rest of the spinal cord above C4-5 and below C5-6 is normal.     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  I strongly recommend that he undergo a C5 corpectomy.  I do not believe discectomies at C4-5 and C5-6 would be able to relieve the pressure on the cord,  which is obviously present behind the body of C5.  This would then involve placing a strut graft using his own bone as a filler and a plate and screw spanning C4 to C6.  Risks and benefits include paralysis; weakness, which is worse now than it was; bowel and bladder dysfunction, weakness in one or both upper extremities, no improvement, possible pain, damage to the nerve roots, and damage to the spinal cord.  Benefits are decompression of the spinal canal, allowing the spinal cord to improve and heal.  There is also fusion failure, hardware failure, and need for reoperation.     Mike Hodge and I spoke at length and I showed him the MRI.  We went over the reasons that I believe he needs to undergo an operation.  Physical therapy, nor medication, nor injections are viable alternatives, as none of them will provide a decompression for the spinal canal.  He was given a sheet detailing  the operation and will contact the office to let us know what it is that he would like to do.  I told him it is not reasonable to expect this to get better, but honestly only to get worse with time if nothing is done.

## 2021-03-04 NOTE — Op Note (Signed)
03/04/2021  7:27 PM  PATIENT:  Mike Hodge  55 y.o. male with cervical spinal stenosis at C4/5,5/6 with hypertrophied posterior longitudinal ligament.   PRE-OPERATIVE DIAGNOSIS:  Cervical cord compression with myelopathy  POST-OPERATIVE DIAGNOSIS: same  PROCEDURE:  Anterior Cervical decompression via C5 corpectomy with microdissection Arthrodesis C4-6 with 64mm titanium allograft packed with autograft morsels Anterior instrumentation(nuvasive translational) C4-6  SURGEON:   Surgeon(s): Coletta Memos, MD Donalee Citrin, MD   ASSISTANTS:Cram, Jillyn Hidden  ANESTHESIA:   general  EBL:  No intake/output data recorded.  BLOOD ADMINISTERED:none  CELL SAVER GIVEN:none  COUNT:per nursing  DRAINS: none   SPECIMEN:  No Specimen  DICTATION: Mr. Sao was taken to the operating room, intubated, and placed under general anesthesia without difficulty. He was positioned supine with his head in slight extension on a horseshoe headrest. The neck was prepped and draped in a sterile manner. I infiltrated 5 cc's 1/2%lidocaine/1:200,000 strength epinephrine into the planned incision starting from the midline to the medial border of the left sternocleidomastoid muscle. I opened the incision with a 10 blade and dissected sharply through soft tissue to the platysma. I dissected in the plane superior to the platysma both rostrally and caudally. I then opened the platysma in a horizontal fashion with Metzenbaum scissors, and dissected in the inferior plane rostrally and caudally. With both blunt and sharp technique I created an avascular corridor to the cervical spine. I placed a spinal needle(s) in the disc space at C3/4 . I then reflected the longus colli from C4 to C6 and placed self retaining retractors. I opened the disc space(s) at 4/5,5/6 with a 15 blade. I removed disc with curettes, Kerrison punches, and the drill. Using the drill I removed osteophytes and prepared for the decompression.  I decompressed the  spinal canal and the C5,6 root(s) with the drill, Kerrison punches, and the curettes. I used the microscope to aid in microdissection. I removed the posterior longitudinal ligament to fully expose and decompress the thecal sac. I exposed the roots laterally taking down the 4/5,5/6 uncovertebral joints. With the decompression complete I moved on to the arthrodesis. I used the drill to level the surfaces of C4, and C6. I removed soft tissue to prepare the disc space and the bony surfaces. I measured the space and placed a 14mm titanium strut graft into the disc space.  We then placed the anterior instrumentation. I placed 2 screws in each vertebral body through the plate. I locked the screws into place. Intraoperative xray showed the graft, plate, and screws to be in good position. I irrigated the wound, achieved hemostasis, and closed the wound in layers. I approximated the platysma, and the subcuticular plane with vicryl sutures. I used Dermabond for a sterile dressing.   PLAN OF CARE: Admit for overnight observation  PATIENT DISPOSITION:  PACU - hemodynamically stable.   Delay start of Pharmacological VTE agent (>24hrs) due to surgical blood loss or risk of bleeding:  yes

## 2021-03-05 DIAGNOSIS — F17200 Nicotine dependence, unspecified, uncomplicated: Secondary | ICD-10-CM | POA: Diagnosis not present

## 2021-03-05 DIAGNOSIS — G9529 Other cord compression: Secondary | ICD-10-CM | POA: Diagnosis not present

## 2021-03-05 DIAGNOSIS — R200 Anesthesia of skin: Secondary | ICD-10-CM | POA: Diagnosis not present

## 2021-03-05 MED ORDER — METHOCARBAMOL 500 MG PO TABS: 500.0000 mg | ORAL_TABLET | Freq: Four times a day (QID) | ORAL | 0 refills | Status: DC

## 2021-03-05 NOTE — Discharge Summary (Signed)
Physician Discharge Summary  Patient ID: Mike Hodge MRN: [id number] DOB/AGE: 55-May-1967 55 y.o.  Admit date: 03/04/2021 Discharge date: 03/05/2021  Admission Diagnoses:  Cervical cord compression with myelopathy     Discharge Diagnoses: same   Discharged Condition: good  Hospital Course: The patient was admitted on 03/04/2021 and taken to the operating room where the patient underwent C5 corpectomy with instrumentation C4-C6. The patient tolerated the procedure well and was taken to the recovery room and then to the floor in stable condition. The hospital course was routine. There were no complications. The wound remained clean dry and intact. Pt had appropriate neck soreness. No complaints of arm pain or new N/T/W. The patient remained afebrile with stable vital signs, and tolerated a regular diet. The patient continued to increase activities, and pain was well controlled with oral pain medications.   Consults: None  Significant Diagnostic Studies:  Results for orders placed or performed during the hospital encounter of 03/04/21  Type and screen [facility]  Result Value Ref Range   ABO/RH(D) O POS    Antibody Screen NEG    Sample Expiration      03/07/2021,2359 Performed at [facility], [address], [place], [place] [address]     DG Cervical Spine Complete  Result Date: 03/04/2021 CLINICAL DATA:  Surgery: Cervical five Corpectomy RSTO: TMS/BIW EXAM: OPERATIVE CERVICAL SPINE 4 VIEW(S) COMPARISON:  01/13/2021 FINDINGS: Four portable cross-table images of the cervical spine are obtained intraoperatively for surgical control purposes. The initial image demonstrates a retractor device anterior to the cervical spine with a localization marker placed over the C3-4 intervertebral space. Additional views demonstrate hardware placement with anterior plate and screw fixation from C4 through C6 with a corpectomy device at C5. IMPRESSION: Intraoperative images  obtained for surgical control purposes. Electronically Signed   By: Burman Nieves M.D.   On: 03/04/2021 17:24    Antibiotics:  Anti-infectives (From admission, onward)    Start     Dose/Rate Route Frequency Ordered Stop   03/04/21 0815  ceFAZolin (ANCEF) IVPB 3g/100 mL premix        3 g 200 mL/hr over 30 Minutes Intravenous On call to O.R. 03/04/21 0809 03/04/21 1507       Discharge Exam: Blood pressure (!) 147/97, pulse 76, temperature 98 F (36.7 C), temperature source Oral, resp. rate 18, height 6' (1.829 m), weight 135.6 kg, SpO2 98 %. Neurologic: Grossly normal Ambulating and voiding well, incision cdi   Discharge Medications:   Allergies as of 03/05/2021   No Known Allergies      Medication List     TAKE these medications    acetaminophen 650 MG CR tablet Commonly known as: TYLENOL Take 1,300 mg by mouth 2 (two) times daily as needed for pain.   methocarbamol 500 MG tablet Commonly known as: Robaxin Take 1 tablet (500 mg total) by mouth 4 (four) times daily.        Disposition: home   Final Dx: C5 corpectomy with instrumentation C4-C6  Discharge Instructions     Call MD for:  difficulty breathing, headache or visual disturbances   Complete by: As directed    Call MD for:  hives   Complete by: As directed    Call MD for:  persistant dizziness or light-headedness   Complete by: As directed    Call MD for:  persistant nausea and vomiting   Complete by: As directed    Call MD for:  redness, tenderness, or  signs of infection (pain, swelling, redness, odor or green/yellow discharge around incision site)   Complete by: As directed    Call MD for:  severe uncontrolled pain   Complete by: As directed    Call MD for:  temperature >100.4   Complete by: As directed    Diet - low sodium heart healthy   Complete by: As directed    Driving Restrictions   Complete by: As directed    No driving for 2 weeks, no riding in the car for 1 week   Increase activity  slowly   Complete by: As directed    Lifting restrictions   Complete by: As directed    No lifting more than 8 lbs   Remove dressing in 48 hours   Complete by: As directed           Signed: Tiana Loft Deseri Loss 03/05/2021, 8:18 AM

## 2021-03-05 NOTE — Progress Notes (Addendum)
Occupational Therapy Evaluation/Discharge Patient Details Name: Mike Hodge MRN: [id number] DOB: 1966-05-03 Today's Date: 03/05/2021    History of Present Illness Per Dr. Ronaldo Miyamoto H&P- "[name] comes in today for evaluation of problems with coordination, problems with his gait, bouncing so to speak when he walks, and problems with his job.  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  [name] states he had no real pain and no other symptoms prior to approximately 3 weeks ago.  But, when it came on it came on suddenly and he has gotten fairly bad very quickly.  [name] reports numbness in his right arm and hand, sometimes numbness in his left hand.  He has no significant pain.  He says that walking will sometimes make the right arm feel more numb.  He states that there was an injury on 01/01/2021.  He works at [employer], again as a Production designer, theatre/television/film   Clinical Impression   Prior to admission, pt was living with his wife in a 1-level house with 4 STE (bilateral hand railings). Pt was independent with ADLs/ADL mobility/IADLs without a mobility device, including working as a Naval architect. However, in the last few weeks pt has required S/A (and crutch support) with ADLs/ADL mobility 2/2 pain and BLE weakness/incoordination. Pt's wife can provide up to 24/7 S/A if needed upon d/c.   Today, pt received sitting in chair, wife present, pt agreeable to OT eval. Pt presents with intact strength/ROM, improved balance/coordination, and motivation to get home. Pt required supervision-mod I for ADLs/ADL mobility within room (no AD) and min cues to slow down to ensure safety. OT reviewed cervical precautions with pt/wife (handout provided), activity pacing, and restrictions for showering/driving. Pt/wife receptive to all education. Pt cleared acute care OT, see recs below. OT signing off, please re-consult OT if pt's current functional status declines.     Follow  Up Recommendations  No OT follow up;Supervision - Intermittent    Equipment Recommendations  None recommended by OT    Recommendations for Other Services Other (comment) (None)     Precautions / Restrictions Precautions Precautions: Cervical;Fall Precaution Booklet Issued: Yes (comment) Precaution Comments: pt and wife receptive of education Restrictions Weight Bearing Restrictions: No      Mobility Bed Mobility Overal bed mobility:  (not tested, in chair upon arrival)       General bed mobility comments: in chair upon arrival    Transfers Overall transfer level: Independent Equipment used: None       General transfer comment: no LOB, required cues to slow down to remain safe, no DME    Balance Overall balance assessment: Independent     ADL either performed or assessed with clinical judgement   ADL Overall ADL's : Modified independent (supervision with more complex tasks)       General ADL Comments: understands driving restriction, will ask about showering technique     Vision Baseline Vision/History: No visual deficits Patient Visual Report: No change from baseline Vision Assessment?: No apparent visual deficits Additional Comments: readers only     Perception Perception Perception Tested?: No   Praxis Praxis Praxis tested?: Not tested    Pertinent Vitals/Pain Pain Assessment: No/denies pain     Hand Dominance Right   Extremity/Trunk Assessment Upper Extremity Assessment Upper Extremity Assessment: Overall WFL for tasks assessed   Lower Extremity Assessment Lower Extremity Assessment: Overall WFL for tasks assessed   Cervical / Trunk Assessment Cervical / Trunk Assessment: Normal  Communication Communication Communication: No difficulties   Cognition Arousal/Alertness: Awake/alert Behavior During Therapy: WFL for tasks assessed/performed Overall Cognitive Status: Within Functional Limits for tasks assessed       General Comments   incision site to anterior neck healing well            Home Living Family/patient expects to be discharged to:: Private residence Living Arrangements: Spouse/significant other Available Help at Discharge: Available 24 hours/day;Family Type of Home: House Home Access: Stairs to enter [employer] of Steps: 4 Entrance Stairs-Rails: Can reach both Home Layout: One level     Bathroom Shower/Tub: Producer, television/film/video: Standard Bathroom Accessibility: Yes How Accessible: Accessible via walker Home Equipment: Crutches          Prior Functioning/Environment Level of Independence: Independent with assistive device(s)        Comments: has recently been using crutches 2/2 pain        OT Problem List: Decreased activity tolerance;Decreased safety awareness      OT Treatment/Interventions:      OT Goals(Current goals can be found in the care plan section) Acute Rehab OT Goals Patient Stated Goal: return home OT Goal Formulation: With patient/family Time For Goal Achievement: 03/19/21 Potential to Achieve Goals: Good   AM-PAC OT "6 Clicks" Daily Activity     Outcome Measure Help from another person eating meals?: None Help from another person taking care of personal grooming?: None Help from another person toileting, which includes using toliet, bedpan, or urinal?: None Help from another person bathing (including washing, rinsing, drying)?: None Help from another person to put on and taking off regular upper body clothing?: None Help from another person to put on and taking off regular lower body clothing?: None 6 Click Score: 24   End of Session Nurse Communication: Mobility status;Other (comment) (clearance for home)  Activity Tolerance: Patient tolerated treatment well Patient left: in chair;with family/visitor present  OT Visit Diagnosis: History of falling (Z91.81)                Time: 8182-9937 OT Time Calculation (min): 19 min Charges:   OT General Charges $OT Visit: 1 Visit OT Evaluation $OT Eval Low Complexity: 1 Low  Norris Cross, OTR/L [facility] Rehab Services [phone]   Mechele Claude 03/05/2021, 10:36 AM

## 2021-03-05 NOTE — Evaluation (Signed)
Physical Therapy Evaluation and Discharge Patient Details Name: Mike Hodge MRN: [id number] DOB: 1965-11-22 Today's Date: 03/05/2021   History of Present Illness  Pt is a 55 y/o male who presents s/p anterior cervical decompression via C5 corpectomy with microdissection arthrodesis C4-6 on 03/04/2021. No significant PMH noted in chart.  Clinical Impression  Patient evaluated by Physical Therapy with no further acute PT needs identified. All education has been completed and the patient has no further questions. Pt was able to demonstrate transfers and ambulation with gross modified independence. Pt requires increased supervision/guarding on the stairs due to LE weakness and control. Pt was educated on precautions, brace application/wearing schedule, appropriate activity progression, and car transfer. See below for any follow-up Physical Therapy or equipment needs. PT is signing off. Thank you for this referral.     Follow Up Recommendations No PT follow up;Supervision - Intermittent    Equipment Recommendations  None recommended by PT    Recommendations for Other Services       Precautions / Restrictions Precautions Precautions: Cervical;Fall Precaution Booklet Issued: Yes (comment) Precaution Comments: Reviewed handout and pt was cued for precautions during functional mobility. Restrictions Weight Bearing Restrictions: No      Mobility  Bed Mobility Overal bed mobility:  (not tested, in chair upon arrival)             General bed mobility comments: Pt reports he will be sleeping in the recliner upon d/c home. Reviewed posture recommendations and utilizing towel rolls or a travel pillow to give neck support while sleeping in the chair.    Transfers Overall transfer level: Independent Equipment used: None             General transfer comment: no LOB, required cues to slow down to remain safe, no DME  Ambulation/Gait Ambulation/Gait assistance: Modified independent  (Device/Increase time) Gait Distance (Feet): 560 Feet Assistive device: None Gait Pattern/deviations: WFL(Within Functional Limits) Gait velocity: WFL Gait velocity interpretation: >2.62 ft/sec, indicative of community ambulatory General Gait Details: Pt ambulating well and with no obvious gait deviations. VC's to slow down at times for safety.  Stairs Stairs: Yes Stairs assistance: Min guard Stair Management: One rail Right;Alternating pattern;Step to pattern;Forwards Number of Stairs: 10 General stair comments: Pt with unsteadiness and LE weakness on stairs. Recommending step-to pattern however pt insisting on alternating pattern.  Wheelchair Mobility    Modified Rankin (Stroke Patients Only)       Balance Overall balance assessment: Independent                                           Pertinent Vitals/Pain Pain Assessment: No/denies pain    Home Living Family/patient expects to be discharged to:: Private residence Living Arrangements: Spouse/significant other Available Help at Discharge: Available 24 hours/day;Family Type of Home: House Home Access: Stairs to enter Entrance Stairs-Rails: Can reach both Entrance Stairs-Number of Steps: 4 Home Layout: One level Home Equipment: Crutches      Prior Function Level of Independence: Independent with assistive device(s)         Comments: has recently been using crutches 2/2 pain     Hand Dominance   Dominant Hand: Right    Extremity/Trunk Assessment   Upper Extremity Assessment Upper Extremity Assessment: RUE deficits/detail RUE Deficits / Details: Pt reports lingering numbness/tingling in fingertips - R worse than L    Lower Extremity Assessment Lower Extremity  Assessment: Generalized weakness    Cervical / Trunk Assessment Cervical / Trunk Assessment: Other exceptions Cervical / Trunk Exceptions: s/p surgery  Communication   Communication: No difficulties  Cognition  Arousal/Alertness: Awake/alert Behavior During Therapy: WFL for tasks assessed/performed Overall Cognitive Status: Within Functional Limits for tasks assessed                                        General Comments General comments (skin integrity, edema, etc.): incision site to anterior neck healing well    Exercises     Assessment/Plan    PT Assessment Patent does not need any further PT services  PT Problem List         PT Treatment Interventions      PT Goals (Current goals can be found in the Care Plan section)  Acute Rehab PT Goals 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 PT Goal Formulation: All assessment and education complete, DC therapy    Frequency     Barriers to discharge        Co-evaluation               AM-PAC PT "6 Clicks" Mobility  Outcome Measure Help needed turning from your back to your side while in a flat bed without using bedrails?: None Help needed moving from lying on your back to sitting on the side of a flat bed without using bedrails?: None Help needed moving to and from a bed to a chair (including a wheelchair)?: None Help needed standing up from a chair using your arms (e.g., wheelchair or bedside chair)?: None Help needed to walk in hospital room?: None Help needed climbing 3-5 steps with a railing? : A Little 6 Click Score: 23    End of Session Equipment Utilized During Treatment: Gait belt Activity Tolerance: Patient tolerated treatment well Patient left: in chair;with call bell/phone within reach;with family/visitor present Nurse Communication: Mobility status PT Visit Diagnosis: Unsteadiness on feet (R26.81);Pain Pain - part of body:  (neck)    Time: 8563-1497 PT Time Calculation (min) (ACUTE ONLY): 10 min   Charges:   PT Evaluation $PT Eval Low Complexity: 1 Low          Conni Slipper, PT, DPT Acute Rehabilitation Services Pager: [phone] Office: [phone]    Marylynn Pearson 03/05/2021, 10:58 AM

## 2021-03-05 NOTE — Discharge Instructions (Signed)
Wound Care °Leave incision open to air. °You may shower. °Do not scrub directly on incision.  °Do not put any creams, lotions, or ointments on incision. °Activity °Walk each and every day, increasing distance each day. °No lifting greater than 5 lbs.  Avoid excessive neck motion. °No driving for 2 weeks; may ride as a passenger locally. ° °Diet °Resume your normal diet.  °Return to Work °Will be discussed at you follow up appointment. °Call Your Doctor If Any of These Occur °Redness, drainage, or swelling at the wound.  °Temperature greater than 101 degrees. °Severe pain not relieved by pain medication. °Increased difficulty swallowing. °Incision starts to come apart. °Follow Up Appt °Call today for appointment in 3 weeks ([phone]) or for problems.  If you have any hardware placed in your spine, you will need an x-ray before your appointment. ° ° °

## 2021-03-05 NOTE — Plan of Care (Signed)
Adequately ready for discharge 

## 2021-03-05 NOTE — Progress Notes (Signed)
Patient alert and oriented, voiding adequately, MAE well with no difficulty. Incision area cdi with no s/s of infection. Patient discharged home per order. Patient and spouse stated understanding of discharge instructions given. Patient has an appointment with Dr. Franky Macho

## 2021-03-07 NOTE — Addendum Note (Signed)
Addendum  created 03/07/21 0808 by Adair Laundry, CRNA   Intraprocedure Meds edited

## 2021-03-08 ENCOUNTER — Encounter (HOSPITAL_COMMUNITY): Payer: Self-pay | Admitting: Neurosurgery

## 2021-03-22 DIAGNOSIS — G9520 Unspecified cord compression: Secondary | ICD-10-CM | POA: Diagnosis not present

## 2021-05-23 ENCOUNTER — Other Ambulatory Visit: Payer: Self-pay

## 2021-05-23 ENCOUNTER — Ambulatory Visit: Payer: BC Managed Care – PPO | Attending: Neurosurgery | Admitting: Physical Therapy

## 2021-05-23 ENCOUNTER — Encounter: Payer: Self-pay | Admitting: Physical Therapy

## 2021-05-23 DIAGNOSIS — R2681 Unsteadiness on feet: Secondary | ICD-10-CM | POA: Insufficient documentation

## 2021-05-23 DIAGNOSIS — M542 Cervicalgia: Secondary | ICD-10-CM | POA: Diagnosis not present

## 2021-05-23 NOTE — Therapy (Signed)
[facility] [address] [place], [place], [address] Phone: [phone]   Fax:  [phone]  Physical Therapy Evaluation  Patient Details  Name: Mike Hodge MRN: [id number] Date of Birth: 03/17/1966 Referring Provider (PT): Coletta Memos MD   Encounter Date: 05/23/2021   PT End of Session - 05/23/21 0953     Visit Number 1    Number of Visits 8    Date for PT Re-Evaluation 07/18/21    PT Start Time 0900    PT Stop Time 0943    PT Time Calculation (min) 43 min    Activity Tolerance Patient tolerated treatment well    Behavior During Therapy [facility] for tasks assessed/performed             Past Medical History:  Diagnosis Date   Medical history non-contributory     Past Surgical History:  Procedure Laterality Date   ANTERIOR CERVICAL CORPECTOMY N/A 03/04/2021   Procedure: Cervical five Corpectomy;  Surgeon: Coletta Memos, MD;  Location: [facility] OR;  Service: Neurosurgery;  Laterality: N/A;   TONSILLECTOMY      There were no vitals filed for this visit.    Subjective Assessment - 05/23/21 0956     Subjective COVID-19 screen performed prior to patient entering clinic.  The patient presents to the clinic s/p neck sugery performed on 03/04/21.  He underwent a C5 Corpectomy with microdissesction and Arthrodesis at C4-6.  His CC today is right sided neck pain rated at 6/10 and a feeling of uncoordination.  He reports falling 4 times prior to surgery but reports no falls since surgery.  His symptoms seems to increase with walking and reduce while sitting.  He reports numbness and shooting pain over both forearm and hands and today states it is worse on the right.    Pertinent History Unremarkable.    How long can you sit comfortably? Unlimited.    How long can you walk comfortably? Around home, short community distances.    Patient Stated Goals Improve coordination and balance.    Currently in Pain? Yes    Pain Score 6     Pain Location  Neck    Pain Orientation Right    Pain Descriptors / Indicators Burning                [facility] PT Assessment - 05/23/21 0001       Assessment   Medical Diagnosis Unspecified cord compression.    Referring Provider (PT) Coletta Memos MD    Onset Date/Surgical Date 03/04/21      Precautions   Precautions Fall      Restrictions   Weight Bearing Restrictions No      Balance Screen   Has the patient fallen in the past 6 months Yes    How many times? 4.    Has the patient had a decrease in activity level because of a fear of falling?  Yes    Is the patient reluctant to leave their home because of a fear of falling?  No      Home Tourist information centre manager residence      Prior Function   Level of Independence Independent      Coordination   Finger Nose Finger Test WNL.    Heel Shin Test Nearly normal.      Posture/Postural Control   Posture/Postural Control Postural limitations    Postural Limitations Rounded Shoulders;Forward head      Deep Tendon Reflexes  DTR Assessment Site Biceps;Brachioradialis;Triceps;Patella;Achilles    Biceps DTR 2+    Brachioradialis DTR 2+    Triceps DTR 2+    Patella DTR 2+      ROM / Strength   AROM / PROM / Strength AROM;Strength      AROM   Overall AROM Comments Left active cervical rotation is 40 degrees and right is 50 degrees.  U/LE ROM is WFL.      Strength   Overall Strength Comments Bilateral UE at elbow normal.  Patient's bilateral hip abduction is 4/5.      Palpation   Palpation comment Tender to palpation over right UT.      Special Tests   Other special tests (+) Romberg.      Ambulation/Gait   Gait Comments The patient exhibits a bilateral Trendelenburg type gait pattern.  Gilmer Mor is recommended.      Standardized Balance Assessment   Standardized Balance Assessment Berg Balance Test      Berg Balance Test   Sit to Stand Able to stand  independently using hands    Standing Unsupported Able to stand  2 minutes with supervision    Sitting with Back Unsupported but Feet Supported on Floor or Stool Able to sit safely and securely 2 minutes    Stand to Sit Controls descent by using hands    Transfers Able to transfer safely, minor use of hands    Standing Unsupported with Eyes Closed Able to stand 3 seconds    Standing Unsupported with Feet Together Needs help to attain position but able to stand for 30 seconds with feet together    From Standing, Reach Forward with Outstretched Arm Can reach confidently >25 cm (10")    From Standing Position, Pick up Object from Floor Able to pick up shoe, needs supervision    From Standing Position, Turn to Look Behind Over each Shoulder Looks behind one side only/other side shows less weight shift    Turn 360 Degrees Able to turn 360 degrees safely but slowly    Standing Unsupported, Alternately Place Feet on Step/Stool Able to complete 4 steps without aid or supervision    Standing Unsupported, One Foot in [employer] balance while stepping or standing    Standing on One Leg Tries to lift leg/unable to hold 3 seconds but remains standing independently    Total Score 35                        Objective measurements completed on examination: See above findings.       [facility] Adult PT Treatment/Exercise - 05/23/21 0001       Modalities   Modalities Electrical Stimulation;Moist Heat      Moist Heat Therapy   Number Minutes Moist Heat 15 Minutes    Moist Heat Location --   RT [place].     Programme researcher, broadcasting/film/video Location RT [place] region.    Electrical Stimulation Action Low-level (non-motoric) IFC at 80-150 Hz.    Electrical Stimulation Parameters 40% scan x 15 minutes.    Electrical Stimulation Goals Pain;Tone                          PT Long Term Goals - 05/23/21 1024       PT LONG TERM GOAL #1   Title Independent with a HEP.    Time 8    Period Weeks    Status  New      PT LONG TERM GOAL #2    Title Improve Berg score to 50.    Time 8    Period Weeks    Status New      PT LONG TERM GOAL #3   Title Negative Romberg test.    Time 8    Period Weeks    Status New      PT LONG TERM GOAL #4   Title Walk a community distance without assistive device.                    Plan - 05/23/21 1017     Clinical Impression Statement The patient presents to [facility] s/p C5 Corpectomy with microdissection and Arthrodesis at C4-6.  He is experiencing continued numbness over both forearm and hands, right > left today.  He also reports right sided neck pain and has an expected loss of active cervical range of motion.  He also c/o a lack of coordination and balance.  He did demonstrate a positive Romberg test today and his Berg score is a 35.  Recommend he use a cane.  His gait is remarkable for a bilateral Trendelenburg type pattern.    Patient will benefit from skilled physical therapy intervention to address pain and deficits.    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   Examination-Activity Limitations Other;Locomotion Level    Examination-Participation Restrictions Other    Stability/Clinical Decision Making Stable/Uncomplicated    Clinical Decision Making Moderate    Rehab Potential Good    PT Frequency 1x / week    PT Duration 8 weeks    PT Treatment/Interventions ADLs/Self Care Home Management;Electrical Stimulation;Moist Heat;Neuromuscular re-education;Balance training;Therapeutic exercise;Therapeutic activities;Functional mobility training;Stair training;Gait training;Patient/family education;Manual techniques    PT Next Visit Plan Work on corrdination, balanance, proprioception and gait activities.  Bilateral hip abduction strengthening.    Consulted and Agree with Plan of Care Patient             Patient will benefit from skilled therapeutic intervention in order to improve the following deficits and impairments:   Abnormal gait, Decreased activity tolerance, Decreased balance, Postural dysfunction, Other (comment), Pain, Increased muscle spasms  Visit Diagnosis: Cervicalgia - Plan: PT plan of care cert/re-cert  Unsteadiness on feet - Plan: PT plan of care cert/re-cert     Problem List Patient Active Problem List   Diagnosis Date Noted   Cervical stenosis of spinal canal 03/04/2021    Embry Huss, [place], PT 05/23/2021, 10:27 AM  [facility] [address] [place], [place], [address] Phone: [phone]   Fax:  [phone]  Name: Jovanni Rash MRN: [id number] Date of Birth: 1965-12-18

## 2021-05-27 ENCOUNTER — Other Ambulatory Visit: Payer: Self-pay

## 2021-05-27 ENCOUNTER — Ambulatory Visit: Payer: BC Managed Care – PPO | Admitting: Physical Therapy

## 2021-05-27 ENCOUNTER — Encounter: Payer: Self-pay | Admitting: Physical Therapy

## 2021-05-27 DIAGNOSIS — M542 Cervicalgia: Secondary | ICD-10-CM | POA: Diagnosis not present

## 2021-05-27 DIAGNOSIS — R2681 Unsteadiness on feet: Secondary | ICD-10-CM

## 2021-05-27 NOTE — Therapy (Signed)
[facility] [address] [place], [place], [address] Phone: [phone]   Fax:  [phone]  Physical Therapy Treatment  Patient Details  Name: Jaedan Huttner MRN: [id number] Date of Birth: [birth year] Referring Provider (PT): Coletta Memos MD   Encounter Date: 05/27/2021   PT End of Session - 05/27/21 0830     Visit Number 2    Number of Visits 8    Date for PT Re-Evaluation 07/18/21    PT Start Time 0817    PT Stop Time 0857    PT Time Calculation (min) 40 min    Activity Tolerance Patient tolerated treatment well    Behavior During Therapy [facility] for tasks assessed/performed             Past Medical History:  Diagnosis Date   Medical history non-contributory     Past Surgical History:  Procedure Laterality Date   ANTERIOR CERVICAL CORPECTOMY N/A 03/04/2021   Procedure: Cervical five Corpectomy;  Surgeon: Coletta Memos, MD;  Location: [facility] OR;  Service: Neurosurgery;  Laterality: N/A;   TONSILLECTOMY      There were no vitals filed for this visit.   Subjective Assessment - 05/27/21 0827     Subjective COVID-19 screen performed prior to patient entering clinic. Reports he had some pain upon waking but none upon arrival. 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 Has sharp pain in R hand with fingers with a lot UE movement.    Pertinent History Unremarkable.    How long can you sit comfortably? Unlimited.    How long can you walk comfortably? Around home, short community distances.    Patient Stated Goals Improve coordination and balance.    Currently in Pain? No/denies                [facility] PT Assessment - 05/27/21 0001       Assessment   Medical Diagnosis Unspecified cord compression.    Referring Provider (PT) Coletta Memos MD    Onset Date/Surgical Date 03/04/21    Next MD Visit 06/21/2021      Precautions   Precautions Fall      Restrictions   Weight Bearing Restrictions No                            [facility] Adult PT Treatment/Exercise - 05/27/21 0001       Exercises   Exercises Knee/Hip;Lumbar      Knee/Hip Exercises: Aerobic   Nustep L2 x12 min      Knee/Hip Exercises: Standing   Heel Raises Both;20 reps    Heel Raises Limitations B toe raise x20 reps    Hip Flexion AROM;Both;2 sets;10 reps;Knee bent    Hip Abduction AROM;Both;20 reps    Forward Step Up Both;15 reps;Hand Hold: 2;Step Height: 6"    Other Standing Knee Exercises DLS on airex x4 min intermittant UE support      Knee/Hip Exercises: Seated   Long Arc Quad Strengthening;Both;2 sets;10 reps;Weights    Long Arc Quad Weight 4 lbs.    Clamshell with TheraBand Red   x20 reps                         PT Long Term Goals - 05/23/21 1024       PT LONG TERM GOAL #1   Title Independent with a HEP.    Time 8    Period Weeks  Status New      PT LONG TERM GOAL #2   Title Improve Berg score to 50.    Time 8    Period Weeks    Status New      PT LONG TERM GOAL #3   Title Negative Romberg test.    Time 8    Period Weeks    Status New      PT LONG TERM GOAL #4   Title Walk a community distance without assistive device.                   Plan - 05/27/21 0859     Clinical Impression Statement Patient presented in clinic with reports of no current pain. Intermittantly during treatment, with UE use and with UE down by his side in rest, he reported numbness and shooting pains. Patient progressed through light strengthening and balance activities. Patient able to self correct with airex static stance today in parallel bars using appropriate ankle strategy. No AD used by patient during treatment today. Patient trying to stay active and walk to his mailbox or within his community. Patient also reports that he and his wife have noticed that he leans to the R and use pillows to prop him.    Personal Factors and Comorbidities Profession;Other    Examination-Activity  Limitations Other;Locomotion Level    Examination-Participation Restrictions Other    Stability/Clinical Decision Making Stable/Uncomplicated    Rehab Potential Good    PT Frequency 1x / week    PT Duration 8 weeks    PT Treatment/Interventions ADLs/Self Care Home Management;Electrical Stimulation;Moist Heat;Neuromuscular re-education;Balance training;Therapeutic exercise;Therapeutic activities;Functional mobility training;Stair training;Gait training;Patient/family education;Manual techniques    PT Next Visit Plan Work on corrdination, balanance, proprioception and gait activities.  Bilateral hip abduction strengthening.    Consulted and Agree with Plan of Care Patient             Patient will benefit from skilled therapeutic intervention in order to improve the following deficits and impairments:  Abnormal gait, Decreased activity tolerance, Decreased balance, Postural dysfunction, Other (comment), Pain, Increased muscle spasms  Visit Diagnosis: Unsteadiness on feet  Cervicalgia     Problem List Patient Active Problem List   Diagnosis Date Noted   Cervical stenosis of spinal canal 03/04/2021    Marvell Fuller, PTA 05/27/2021, 9:08 AM  [facility] [address] [place], [place], [address] Phone: [phone]   Fax:  [phone]  Name: Klay Sobotka MRN: [id number] Date of Birth: 1966-06-16

## 2021-05-30 ENCOUNTER — Encounter: Payer: Self-pay | Admitting: Physical Therapy

## 2021-05-30 ENCOUNTER — Ambulatory Visit: Payer: BC Managed Care – PPO | Admitting: Physical Therapy

## 2021-05-30 ENCOUNTER — Other Ambulatory Visit: Payer: Self-pay

## 2021-05-30 DIAGNOSIS — R2681 Unsteadiness on feet: Secondary | ICD-10-CM | POA: Diagnosis not present

## 2021-05-30 DIAGNOSIS — M542 Cervicalgia: Secondary | ICD-10-CM | POA: Diagnosis not present

## 2021-05-30 NOTE — Therapy (Signed)
[facility] [address] [place], [place], [address] Phone: [phone]   Fax:  [phone]  Physical Therapy Treatment  Patient Details  Name: Mike Hodge MRN: [id number] Date of Birth: Aug 01, 1965 Referring Provider (PT): Coletta Memos MD   Encounter Date: 05/30/2021   PT End of Session - 05/30/21 0906     Visit Number 3    Number of Visits 8    Date for PT Re-Evaluation 07/18/21    PT Start Time 0900    PT Stop Time 0940    PT Time Calculation (min) 40 min    Activity Tolerance Patient tolerated treatment well    Behavior During Therapy [facility] for tasks assessed/performed             Past Medical History:  Diagnosis Date   Medical history non-contributory     Past Surgical History:  Procedure Laterality Date   ANTERIOR CERVICAL CORPECTOMY N/A 03/04/2021   Procedure: Cervical five Corpectomy;  Surgeon: Coletta Memos, MD;  Location: [facility] OR;  Service: Neurosurgery;  Laterality: N/A;   TONSILLECTOMY      There were no vitals filed for this visit.   Subjective Assessment - 05/30/21 0905     Subjective COVID-19 screen performed prior to patient entering clinic. Reports some neck soreness.    Pertinent History Unremarkable.    How long can you sit comfortably? Unlimited.    How long can you walk comfortably? Around home, short community distances.    Patient Stated Goals Improve coordination and balance.    Currently in Pain? Yes    Pain Score 6     Pain Location Neck    Pain Orientation Posterior    Pain Descriptors / Indicators Sore    Pain Type Surgical pain    Pain Onset Today    Pain Frequency Intermittent    Multiple Pain Sites Yes    Pain Score 9    Pain Location Knee    Pain Orientation Right;Left    Pain Descriptors / Indicators Discomfort    Pain Type Chronic pain    Pain Onset Today    Pain Frequency Constant    Aggravating Factors  cold weather                [facility] PT Assessment - 05/30/21  0001       Assessment   Medical Diagnosis Unspecified cord compression.    Referring Provider (PT) Coletta Memos MD    Onset Date/Surgical Date 03/04/21    Next MD Visit 06/21/2021      Precautions   Precautions [facility] Adult PT Treatment/Exercise - 05/30/21 0001       Exercises   Exercises Knee/Hip;Neck      Neck Exercises: Seated   Cervical Isometrics Extension;Right lateral flexion;Left lateral flexion;15 reps;3 secs    Other Seated Exercise scapular retraction x15 reps      Knee/Hip Exercises: Aerobic   Nustep L2 x15 min      Knee/Hip Exercises: Standing   Heel Raises Both;20 reps    Heel Raises Limitations B toe raise x20 reps    Hip Flexion AROM;Both;2 sets;10 reps;Knee bent    Hip Abduction AROM;Both;20 reps    Forward Step Up Both;15 reps;Hand Hold: 2;Step Height: 6"    Other Standing Knee Exercises DLS on airex x4 min  Knee/Hip Exercises: Seated   Long Arc Quad Strengthening;Both;2 sets;10 reps;Weights    Long Arc Quad Weight 3 lbs.    Clamshell with TheraBand Red   x20 reps   Sit to Sand 10 reps;without UE support                          PT Long Term Goals - 05/23/21 1024       PT LONG TERM GOAL #1   Title Independent with a HEP.    Time 8    Period Weeks    Status New      PT LONG TERM GOAL #2   Title Improve Berg score to 50.    Time 8    Period Weeks    Status New      PT LONG TERM GOAL #3   Title Negative Romberg test.    Time 8    Period Weeks    Status New      PT LONG TERM GOAL #4   Title Walk a community distance without assistive device.                   Plan - 05/30/21 0941     Clinical Impression Statement Patient presented in clinic with reports of more B knee pain and cervical soreness. Patient able to tolerate all therex fairly well although he continues to fatigue fairly quickly. Patient went with his daughter shopping last week  and he noted a lot  of LE soreness. Does not currently use an AD but especially for long term ambulation and standing, an AD was encouraged. New cervical isometrics completed in order to promote strength and stability.    Personal Factors and Comorbidities Profession;Other    Examination-Activity Limitations Other;Locomotion Level    Examination-Participation Restrictions Other    Stability/Clinical Decision Making Stable/Uncomplicated    Rehab Potential Good    PT Frequency 1x / week    PT Duration 8 weeks    PT Treatment/Interventions ADLs/Self Care Home Management;Electrical Stimulation;Moist Heat;Neuromuscular re-education;Balance training;Therapeutic exercise;Therapeutic activities;Functional mobility training;Stair training;Gait training;Patient/family education;Manual techniques    PT Next Visit Plan Work on corrdination, balanance, proprioception and gait activities.  Bilateral hip abduction strengthening.    Consulted and Agree with Plan of Care Patient             Patient will benefit from skilled therapeutic intervention in order to improve the following deficits and impairments:  Abnormal gait, Decreased activity tolerance, Decreased balance, Postural dysfunction, Other (comment), Pain, Increased muscle spasms  Visit Diagnosis: Unsteadiness on feet  Cervicalgia     Problem List Patient Active Problem List   Diagnosis Date Noted   Cervical stenosis of spinal canal 03/04/2021    Marvell Fuller, PTA 05/30/2021, 9:58 AM  [facility] [address] [place], [place], [address] Phone: [phone]   Fax:  [phone]  Name: Mike Hodge MRN: [id number] Date of Birth: 1965-10-29

## 2021-06-03 ENCOUNTER — Other Ambulatory Visit: Payer: Self-pay

## 2021-06-03 ENCOUNTER — Ambulatory Visit: Payer: BC Managed Care – PPO | Admitting: *Deleted

## 2021-06-03 DIAGNOSIS — R2681 Unsteadiness on feet: Secondary | ICD-10-CM | POA: Diagnosis not present

## 2021-06-03 DIAGNOSIS — M542 Cervicalgia: Secondary | ICD-10-CM | POA: Diagnosis not present

## 2021-06-03 NOTE — Therapy (Signed)
[facility] [address] [place], [place], [address] Phone: [phone]   Fax:  [phone]  Physical Therapy Treatment  Patient Details  Name: Mike Hodge MRN: [id number] Date of Birth: [birth year] Referring Provider (PT): Coletta Memos MD   Encounter Date: 06/03/2021   PT End of Session - 06/03/21 0823     Visit Number 4    Number of Visits 8    Date for PT Re-Evaluation 07/18/21    PT Start Time 0815    PT Stop Time 0904    PT Time Calculation (min) 49 min             Past Medical History:  Diagnosis Date   Medical history non-contributory     Past Surgical History:  Procedure Laterality Date   ANTERIOR CERVICAL CORPECTOMY N/A 03/04/2021   Procedure: Cervical five Corpectomy;  Surgeon: Coletta Memos, MD;  Location: [facility] OR;  Service: Neurosurgery;  Laterality: N/A;   TONSILLECTOMY      There were no vitals filed for this visit.   Subjective Assessment - 06/03/21 0822     Subjective COVID-19 screen performed prior to patient entering clinic. Reports some neck soreness.Did okay after last Rx    How long can you sit comfortably? Unlimited.    How long can you walk comfortably? Around home, short community distances.    Patient Stated Goals Improve coordination and balance.    Currently in Pain? Yes    Pain Score 6     Pain Location Neck    Pain Orientation Posterior    Pain Descriptors / Indicators Sore                               [facility] Adult PT Treatment/Exercise - 06/03/21 0001       Exercises   Exercises Knee/Hip;Neck      Knee/Hip Exercises: Aerobic   Nustep L3 x15 min      Knee/Hip Exercises: Standing   Heel Raises Both;3 sets;10 reps    Heel Raises Limitations B toe raise3 x10 reps    Hip Flexion AROM;Both;10 reps;Knee bent;3 sets    Hip Abduction AROM;Both;3 sets;10 reps    Forward Step Up --    Rocker Board 3 minutes    Other Standing Knee Exercises Beam  heel/toe walking  x6,  side stepping x 6 rounds    Other Standing Knee Exercises tandem stance holds x 3 hold 30 secs      Knee/Hip Exercises: Seated   Long Arc Quad --    Long Arc [employer] --    Clamshell with TheraBand --   x20 reps   Sit to [employer] --                          PT Long Term Goals - 05/23/21 1024       PT LONG TERM GOAL #1   Title Independent with a HEP.    Time 8    Period Weeks    Status New      PT LONG TERM GOAL #2   Title Improve Berg score to 50.    Time 8    Period Weeks    Status New      PT LONG TERM GOAL #3   Title Negative Romberg test.    Time 8    Period Weeks    Status New  PT LONG TERM GOAL #4   Title Walk a community distance without assistive device.                   Plan - 06/03/21 0823     Clinical Impression Statement Pt arrived today in good spirits and reports performing HEP.Rx focused on LE strengthening as well as balalnce and coordination acts in // bars. Added tandem stance holds to HEP. Great job today    Personal Factors and Comorbidities Profession;Other    Examination-Activity Limitations Other;Locomotion Level    Rehab Potential Good    PT Frequency 1x / week    PT Duration 8 weeks    PT Treatment/Interventions ADLs/Self Care Home Management;Electrical Stimulation;Moist Heat;Neuromuscular re-education;Balance training;Therapeutic exercise;Therapeutic activities;Functional mobility training;Stair training;Gait training;Patient/family education;Manual techniques    PT Next Visit Plan Work on corrdination, balanance, proprioception and gait activities.  Bilateral hip abduction strengthening.    Consulted and Agree with Plan of Care Patient             Patient will benefit from skilled therapeutic intervention in order to improve the following deficits and impairments:  Abnormal gait, Decreased activity tolerance, Decreased balance, Postural dysfunction, Other (comment), Pain, Increased muscle  spasms  Visit Diagnosis: Unsteadiness on feet  Cervicalgia     Problem List Patient Active Problem List   Diagnosis Date Noted   Cervical stenosis of spinal canal 03/04/2021    Jazmina Muhlenkamp,CHRIS, PTA 06/03/2021, 9:15 AM  [facility] [address] [place], [place], [address] Phone: [phone]   Fax:  [phone]  Name: Janes Colegrove MRN: [id number] Date of Birth: [birth year]

## 2021-06-06 ENCOUNTER — Other Ambulatory Visit: Payer: Self-pay

## 2021-06-06 ENCOUNTER — Ambulatory Visit: Payer: BC Managed Care – PPO | Admitting: *Deleted

## 2021-06-06 DIAGNOSIS — R2681 Unsteadiness on feet: Secondary | ICD-10-CM | POA: Diagnosis not present

## 2021-06-06 DIAGNOSIS — M542 Cervicalgia: Secondary | ICD-10-CM | POA: Diagnosis not present

## 2021-06-06 NOTE — Therapy (Signed)
[facility] [address] [place], [place], [address] Phone: [phone]   Fax:  [phone]  Physical Therapy Treatment  Patient Details  Name: Mike Hodge MRN: [id number] Date of Birth: 1965-12-11 Referring Provider (PT): Coletta Memos MD   Encounter Date: 06/06/2021   PT End of Session - 06/06/21 1305     Visit Number 5    Number of Visits 8    Date for PT Re-Evaluation 07/18/21    PT Start Time 0815    PT Stop Time 0904    PT Time Calculation (min) 49 min             Past Medical History:  Diagnosis Date   Medical history non-contributory     Past Surgical History:  Procedure Laterality Date   ANTERIOR CERVICAL CORPECTOMY N/A 03/04/2021   Procedure: Cervical five Corpectomy;  Surgeon: Coletta Memos, MD;  [facility]: [facility] OR;  Service: Neurosurgery;  Laterality: N/A;   TONSILLECTOMY      There were no vitals filed for this visit.   Subjective Assessment - 06/06/21 0840     Subjective COVID-19 screen performed prior to patient entering clinic. Reports some neck soreness.Did okay after last Rx. soreness in neck at night    Pertinent History Unremarkable.    How long can you sit comfortably? Unlimited.    How long can you walk comfortably? Around home, short community distances.    Patient Stated Goals Improve coordination and balance.    Currently in Pain? Yes    Pain Score 6     Pain Location Neck    Pain Orientation Posterior                               [facility] Adult PT Treatment/Exercise - 06/06/21 0001       Exercises   Exercises Knee/Hip;Neck      Knee/Hip Exercises: Aerobic   Nustep L3 x15 min      Knee/Hip Exercises: Standing   Heel Raises Both;3 sets;10 reps    Heel Raises Limitations B toe raise3 x10 reps    Hip Flexion AROM;Both;10 reps;Knee bent;3 sets    Hip Abduction AROM;Both;3 sets;10 reps    Rocker Board 4 minutes   DF/PF x10, balance   Other Standing Knee Exercises  Beam  heel/toe walking x6,  side stepping x 6 rounds    Other Standing Knee Exercises tandem stance holds x 3 hold 30 secs on balance beam      Knee/Hip Exercises: Seated   Clamshell with Carman Ching   x20 reps                         PT Long Term Goals - 05/23/21 1024       PT LONG TERM GOAL #1   Title Independent with a HEP.    Time 8    Period Weeks    Status New      PT LONG TERM GOAL #2   Title Improve Berg score to 50.    Time 8    Period Weeks    Status New      PT LONG TERM GOAL #3   Title Negative Romberg test.    Time 8    Period Weeks    Status New      PT LONG TERM GOAL #4   Title Walk a community distance without assistive device.  Plan - 06/06/21 1306     Clinical Impression Statement Pt arrived today doing fairly well. He was able to perform LE exs as well as proprioception/balanceAct.'s with short breaks at times. Tactile and verbal cues needed for technique PRN. Pt reports some knee pain at times during exs, but did well.    Personal Factors and Comorbidities Profession;Other    Examination-Activity Limitations Other;Locomotion Level    Examination-Participation Restrictions Other    Stability/Clinical Decision Making Stable/Uncomplicated    Rehab Potential Good    PT Frequency 1x / week    PT Duration 8 weeks    PT Treatment/Interventions ADLs/Self Care Home Management;Electrical Stimulation;Moist Heat;Neuromuscular re-education;Balance training;Therapeutic exercise;Therapeutic activities;Functional mobility training;Stair training;Gait training;Patient/family education;Manual techniques    PT Next Visit Plan Work on corrdination, balanance, proprioception and gait activities.  Bilateral hip abduction strengthening.    Consulted and Agree with Plan of Care Patient             Patient will benefit from skilled therapeutic intervention in order to improve the following deficits and impairments:   Abnormal gait, Decreased activity tolerance, Decreased balance, Postural dysfunction, Other (comment), Pain, Increased muscle spasms  Visit Diagnosis: Unsteadiness on feet  Cervicalgia     Problem List Patient Active Problem List   Diagnosis Date Noted   Cervical stenosis of spinal canal 03/04/2021    Eddie Payette,CHRIS, PTA 06/06/2021, 1:13 PM  [facility] [address] [place], [place], [address] Phone: [phone]   Fax:  [phone]  Name: Mike Hodge MRN: [id number] Date of Birth: [birth year]

## 2021-06-08 ENCOUNTER — Ambulatory Visit: Payer: BC Managed Care – PPO | Admitting: *Deleted

## 2021-06-20 ENCOUNTER — Other Ambulatory Visit: Payer: Self-pay

## 2021-06-20 ENCOUNTER — Ambulatory Visit: Payer: BC Managed Care – PPO | Attending: Neurosurgery | Admitting: Physical Therapy

## 2021-06-20 ENCOUNTER — Encounter: Payer: Self-pay | Admitting: Physical Therapy

## 2021-06-20 DIAGNOSIS — M542 Cervicalgia: Secondary | ICD-10-CM | POA: Insufficient documentation

## 2021-06-20 DIAGNOSIS — R2681 Unsteadiness on feet: Secondary | ICD-10-CM | POA: Diagnosis not present

## 2021-06-20 NOTE — Therapy (Signed)
[facility] [address] [place], [place], [address] Phone: [phone]   Fax:  [phone]  Physical Therapy Treatment  Patient Details  Name: Mike Hodge MRN: [id number] Date of Birth: 09/04/1965 Referring Provider (PT): Coletta Memos MD   Encounter Date: 06/20/2021   PT End of Session - 06/20/21 0733     Visit Number 6    Number of Visits 8    Date for PT Re-Evaluation 07/18/21    PT Start Time 0731    PT Stop Time 0812    PT Time Calculation (min) 41 min    Activity Tolerance Patient tolerated treatment well    Behavior During Therapy [facility] for tasks assessed/performed             Past Medical History:  Diagnosis Date   Medical history non-contributory     Past Surgical History:  Procedure Laterality Date   ANTERIOR CERVICAL CORPECTOMY N/A 03/04/2021   Procedure: Cervical five Corpectomy;  Surgeon: Coletta Memos, MD;  Location: [facility] OR;  Service: Neurosurgery;  Laterality: N/A;   TONSILLECTOMY      There were no vitals filed for this visit.   Subjective Assessment - 06/20/21 0732     Subjective COVID-19 screen performed prior to patient entering clinic. States that he has been using the cane and that has really helped his balance.    Pertinent History Unremarkable.    How long can you sit comfortably? Unlimited.    How long can you walk comfortably? Around home, short community distances.    Patient Stated Goals Improve coordination and balance.    Currently in Pain? No/denies                [facility] PT Assessment - 06/20/21 0001       Assessment   Medical Diagnosis Unspecified cord compression.    Referring Provider (PT) Coletta Memos MD    Onset Date/Surgical Date 03/04/21    Next MD Visit 06/21/2021      Precautions   Precautions Fall                           [facility] Adult PT Treatment/Exercise - 06/20/21 0001       Neck Exercises: Machines for Strengthening   Nustep L3 x16 min       Knee/Hip Exercises: Standing   Heel Raises Both;3 sets;10 reps    Heel Raises Limitations B toe raise3 x10 reps    Hip Flexion Stengthening;Both;20 reps;Knee bent;Limitations    Hip Flexion Limitations red theraband    Hip Abduction Stengthening;Both;20 reps;Knee straight;Limitations    Abduction Limitations red theraband                 Balance Exercises - 06/20/21 0001       Balance Exercises: Standing   Standing Eyes Opened Narrow base of support (BOS);Foam/compliant surface;Time    Standing Eyes Opened Time 1 min    Standing, One Foot on a Step Eyes open;6 inch;Time;Head turns    Standing, One Foot on a Step Time x2 min each    Rockerboard Anterior/posterior;EO;Intermittent UE support   x3 min   Tandem Gait Forward;Retro;Intermittent upper extremity support;Foam/compliant surface;5 reps    Sidestepping Foam/compliant support;Upper extremity support;5 reps    Sit to Stand Standard surface;Without upper extremity support;Foam/compliant surface;Limitations    Sit to Stand Limitations x5 reps  PT Long Term Goals - 05/23/21 1024       PT LONG TERM GOAL #1   Title Independent with a HEP.    Time 8    Period Weeks    Status New      PT LONG TERM GOAL #2   Title Improve Berg score to 50.    Time 8    Period Weeks    Status New      PT LONG TERM GOAL #3   Title Negative Romberg test.    Time 8    Period Weeks    Status New      PT LONG TERM GOAL #4   Title Walk a community distance without assistive device.                   Plan - 06/20/21 5621     Clinical Impression Statement Patient presented in clinic with no new complaints. Patient does report that using SPC during gait has helped in regards to balance. Patient indicated that neuro UE symptoms remain the same intensity. Patient more challenged with uneven surface and LLE SLS modified.    Personal Factors and Comorbidities Profession;Other    Examination-Activity  Limitations Other;Locomotion Level    Examination-Participation Restrictions Other    Stability/Clinical Decision Making Stable/Uncomplicated    Rehab Potential Good    PT Frequency 1x / week    PT Duration 8 weeks    PT Treatment/Interventions ADLs/Self Care Home Management;Electrical Stimulation;Moist Heat;Neuromuscular re-education;Balance training;Therapeutic exercise;Therapeutic activities;Functional mobility training;Stair training;Gait training;Patient/family education;Manual techniques    PT Next Visit Plan Work on corrdination, balanance, proprioception and gait activities.  Bilateral hip abduction strengthening.    Consulted and Agree with Plan of Care Patient             Patient will benefit from skilled therapeutic intervention in order to improve the following deficits and impairments:  Abnormal gait, Decreased activity tolerance, Decreased balance, Postural dysfunction, Other (comment), Pain, Increased muscle spasms  Visit Diagnosis: Unsteadiness on feet  Cervicalgia     Problem List Patient Active Problem List   Diagnosis Date Noted   Cervical stenosis of spinal canal 03/04/2021    Marvell Fuller, PTA 06/20/2021, 10:16 AM  [facility] [address] [place], [place], [address] Phone: [phone]   Fax:  [phone]  Name: Mike Hodge MRN: [id number] Date of Birth: Sep 07, 1965

## 2021-06-21 DIAGNOSIS — G9520 Unspecified cord compression: Secondary | ICD-10-CM | POA: Diagnosis not present

## 2021-06-21 DIAGNOSIS — I10 Essential (primary) hypertension: Secondary | ICD-10-CM | POA: Diagnosis not present

## 2021-06-21 DIAGNOSIS — Z6841 Body Mass Index (BMI) 40.0 and over, adult: Secondary | ICD-10-CM | POA: Diagnosis not present

## 2021-06-24 ENCOUNTER — Encounter: Payer: Self-pay | Admitting: Physical Therapy

## 2021-06-24 ENCOUNTER — Other Ambulatory Visit: Payer: Self-pay

## 2021-06-24 ENCOUNTER — Ambulatory Visit: Payer: BC Managed Care – PPO | Admitting: Physical Therapy

## 2021-06-24 DIAGNOSIS — R2681 Unsteadiness on feet: Secondary | ICD-10-CM

## 2021-06-24 DIAGNOSIS — M542 Cervicalgia: Secondary | ICD-10-CM | POA: Diagnosis not present

## 2021-06-24 NOTE — Therapy (Signed)
[facility] [address] [place], [place], [address] Phone: [phone]   Fax:  [phone]  Physical Therapy Treatment  Patient Details  Name: Mike Hodge MRN: [id number] Date of Birth: [birth year] Referring Provider (PT): Coletta Memos MD   Encounter Date: 06/24/2021   PT End of Session - 06/24/21 0745     Visit Number 7    Number of Visits 8    Date for PT Re-Evaluation 07/18/21    PT Start Time 0734    PT Stop Time 0814    PT Time Calculation (min) 40 min    Activity Tolerance Patient tolerated treatment well    Behavior During Therapy [facility] for tasks assessed/performed             Past Medical History:  Diagnosis Date   Medical history non-contributory     Past Surgical History:  Procedure Laterality Date   ANTERIOR CERVICAL CORPECTOMY N/A 03/04/2021   Procedure: Cervical five Corpectomy;  Surgeon: Coletta Memos, MD;  Location: [facility] OR;  Service: Neurosurgery;  Laterality: N/A;   TONSILLECTOMY      There were no vitals filed for this visit.   Subjective Assessment - 06/24/21 0743     Subjective COVID-19 screen performed prior to patient entering clinic. No new complaints. Patient indicates that neurological symptoms are the same.    Pertinent History Unremarkable.    How long can you sit comfortably? Unlimited.    How long can you walk comfortably? Around home, short community distances.    Patient Stated Goals Improve coordination and balance.    Currently in Pain? No/denies                [facility] PT Assessment - 06/24/21 0001       Assessment   Medical Diagnosis Unspecified cord compression.    Referring Provider (PT) Coletta Memos MD    Onset Date/Surgical Date 03/04/21    Next MD Visit 08/08/2020      Precautions   Precautions Fall                           [facility] Adult PT Treatment/Exercise - 06/24/21 0001       Neck Exercises: Machines for Strengthening   Nustep L4 x15 min       Knee/Hip Exercises: Standing   Heel Raises Both;3 sets;10 reps    Heel Raises Limitations B toe raise3 x10 reps    Hip Abduction Stengthening;Both;20 reps;Knee straight;Limitations    Lateral Step Up Both;3 sets;10 reps;Hand Hold: 2;Step Height: 6"    Forward Step Up Both;3 sets;10 reps;Hand Hold: 2;Step Height: 6"                 Balance Exercises - 06/24/21 0001       Balance Exercises: Standing   Standing Eyes Opened Narrow base of support (BOS);Foam/compliant surface;Head turns    Standing, One Foot on a Step Eyes open;8 inch;Time    Standing, One Foot on a Step Time x1 mn each    Lift / Chop Both;15 reps;Cognitive challenge;Limitations    Lift / Chop Limitations on airex with conversation and recall for cognitive challenge                     PT Long Term Goals - 05/23/21 1024       PT LONG TERM GOAL #1   Title Independent with a HEP.    Time 8  Period Weeks    Status New      PT LONG TERM GOAL #2   Title Improve Berg score to 50.    Time 8    Period Weeks    Status New      PT LONG TERM GOAL #3   Title Negative Romberg test.    Time 8    Period Weeks    Status New      PT LONG TERM GOAL #4   Title Walk a community distance without assistive device.                   Plan - 06/24/21 0818     Clinical Impression Statement Patient presented in clinic with reports of continued neurological symptoms. Patient using [facility] for gait while entering clinic. Patient progressed with balance activities with trunk rotation as well as conversation and recall for cognitive challenge. Minimal challenge with L trunk rotation compared to R trunk rotation on airex. WNL cervical rotation noted but states if he sits in his recliner for long he experiences stiffness.    Personal Factors and Comorbidities Profession;Other    Examination-Activity Limitations Other;Locomotion Level    Examination-Participation Restrictions Other    Stability/Clinical  Decision Making Stable/Uncomplicated    Rehab Potential Good    PT Frequency 1x / week    PT Duration 8 weeks    PT Treatment/Interventions ADLs/Self Care Home Management;Electrical Stimulation;Moist Heat;Neuromuscular re-education;Balance training;Therapeutic exercise;Therapeutic activities;Functional mobility training;Stair training;Gait training;Patient/family education;Manual techniques    PT Next Visit Plan Work on corrdination, balanance, proprioception and gait activities.  Bilateral hip abduction strengthening.    Consulted and Agree with Plan of Care Patient             Patient will benefit from skilled therapeutic intervention in order to improve the following deficits and impairments:  Abnormal gait, Decreased activity tolerance, Decreased balance, Postural dysfunction, Other (comment), Pain, Increased muscle spasms  Visit Diagnosis: Unsteadiness on feet  Cervicalgia     Problem List Patient Active Problem List   Diagnosis Date Noted   Cervical stenosis of spinal canal 03/04/2021    Marvell Fuller, PTA 06/24/2021, 8:21 AM  [facility] [address] [place], [place], [address] Phone: [phone]   Fax:  [phone]  Name: Mike Hodge MRN: [id number] Date of Birth: [birth year]

## 2021-06-27 ENCOUNTER — Ambulatory Visit: Payer: BC Managed Care – PPO | Admitting: Physical Therapy

## 2021-06-27 ENCOUNTER — Other Ambulatory Visit: Payer: Self-pay

## 2021-06-27 ENCOUNTER — Encounter: Payer: Self-pay | Admitting: Physical Therapy

## 2021-06-27 DIAGNOSIS — R2681 Unsteadiness on feet: Secondary | ICD-10-CM | POA: Diagnosis not present

## 2021-06-27 DIAGNOSIS — M542 Cervicalgia: Secondary | ICD-10-CM

## 2021-06-27 NOTE — Addendum Note (Signed)
Addended by: Marcena Dias, [place] W on: 06/27/2021 09:12 AM   Modules accepted: Orders

## 2021-06-27 NOTE — Therapy (Signed)
[facility] [address] [place], [place], [address] Phone: [phone]   Fax:  [phone]  Physical Therapy Treatment  Patient Details  Name: Kacy Conely MRN: [id number] Date of Birth: 02-16-1966 Referring Provider (PT): Coletta Memos MD   Encounter Date: 06/27/2021   PT End of Session - 06/27/21 0739     Visit Number 8    Number of Visits 8    Date for PT Re-Evaluation 07/18/21    PT Start Time 0731    PT Stop Time 0815    PT Time Calculation (min) 44 min    Activity Tolerance Patient tolerated treatment well    Behavior During Therapy [facility] for tasks assessed/performed             Past Medical History:  Diagnosis Date   Medical history non-contributory     Past Surgical History:  Procedure Laterality Date   ANTERIOR CERVICAL CORPECTOMY N/A 03/04/2021   Procedure: Cervical five Corpectomy;  Surgeon: Coletta Memos, MD;  Location: [facility] OR;  Service: Neurosurgery;  Laterality: N/A;   TONSILLECTOMY      There were no vitals filed for this visit.   Subjective Assessment - 06/27/21 0739     Subjective COVID-19 screen performed prior to patient entering clinic. No new issues.    Pertinent History Unremarkable.    How long can you walk comfortably? Around home, short community distances.    Patient Stated Goals Improve coordination and balance.    Currently in Pain? No/denies                [facility] PT Assessment - 06/27/21 0001       Assessment   Medical Diagnosis Unspecified cord compression.    Referring Provider (PT) Coletta Memos MD    Onset Date/Surgical Date 03/04/21    Next MD Visit 08/09/2020      Precautions   Precautions Fall                           [facility] Adult PT Treatment/Exercise - 06/27/21 0001       Neck Exercises: Machines for Strengthening   Nustep L4 x15 min      Lumbar Exercises: Machines for Strengthening   Cybex Knee Extension 10# 3x10 reps    Cybex Knee Flexion 30#  3x10 reps      Lumbar Exercises: Standing   Heel Raises 20 reps    Heel Raises Limitations B toe raise x20 reps      Knee/Hip Exercises: Standing   Forward Step Up Both;20 reps;Hand Hold: 2;Step Height: 6"                 Balance Exercises - 06/27/21 0001       Balance Exercises: Standing   Standing Eyes Opened Narrow base of support (BOS);Foam/compliant surface;Head turns    Standing Eyes Closed Narrow base of support (BOS);Foam/compliant surface;Head turns    Standing, One Foot on a Step Eyes open;Head turns;6 inch    Standing, One Foot on a Step Time x2 min each    Other Standing Exercises DLS on BOSU x3 min                     PT Long Term Goals - 06/27/21 0850       PT LONG TERM GOAL #1   Title Independent with a HEP.    Time 8    Period Weeks    Status Achieved  PT LONG TERM GOAL #2   Title Improve Berg score to 50.    Time 8    Period Weeks    Status On-going      PT LONG TERM GOAL #3   Title Negative Romberg test.    Time 8    Period Weeks    Status On-going      PT LONG TERM GOAL #4   Title Walk a community distance without assistive device.    Status On-going                   Plan - 06/27/21 0825     Clinical Impression Statement Patient presented in clinic with reports of more L hand numbness on Nustep this morning than usual. No new issues today but patient still is unstable when standing and patient and wife still noticing that he leans to R side. Patient able to tolerate all therex well and balance training. More unstable and visual challenges provided.    Personal Factors and Comorbidities Profession;Other    Examination-Activity Limitations Other;Locomotion Level    Examination-Participation Restrictions Other    Stability/Clinical Decision Making Stable/Uncomplicated    Rehab Potential Good    PT Frequency 1x / week    PT Duration 8 weeks    PT Treatment/Interventions ADLs/Self Care Home Management;Electrical  Stimulation;Moist Heat;Neuromuscular re-education;Balance training;Therapeutic exercise;Therapeutic activities;Functional mobility training;Stair training;Gait training;Patient/family education;Manual techniques    PT Next Visit Plan Work on corrdination, balanance, proprioception and gait activities.  Bilateral hip abduction strengthening.    Consulted and Agree with Plan of Care Patient             Patient will benefit from skilled therapeutic intervention in order to improve the following deficits and impairments:  Abnormal gait, Decreased activity tolerance, Decreased balance, Postural dysfunction, Other (comment), Pain, Increased muscle spasms  Visit Diagnosis: Unsteadiness on feet  Cervicalgia     Problem List Patient Active Problem List   Diagnosis Date Noted   Cervical stenosis of spinal canal 03/04/2021    Marvell Fuller, PTA 06/27/2021, 8:50 AM  [facility] [address] [place], [place], [address] Phone: [phone]   Fax:  [phone]  Name: Cantrell Larouche MRN: [id number] Date of Birth: 1965-12-02

## 2021-07-01 ENCOUNTER — Encounter: Payer: Self-pay | Admitting: Physical Therapy

## 2021-07-01 ENCOUNTER — Other Ambulatory Visit: Payer: Self-pay

## 2021-07-01 ENCOUNTER — Ambulatory Visit: Payer: BC Managed Care – PPO | Admitting: Physical Therapy

## 2021-07-01 DIAGNOSIS — R2681 Unsteadiness on feet: Secondary | ICD-10-CM

## 2021-07-01 DIAGNOSIS — M542 Cervicalgia: Secondary | ICD-10-CM

## 2021-07-01 NOTE — Therapy (Addendum)
[facility] [place], [place], [address] Phone: [phone]   Fax:  [phone]  Physical Therapy Treatment  Patient Details  Name: Mike Hodge MRN: [id number] Date of Birth: Feb 22, 1966 Referring Provider (PT): Ashok Pall MD   Encounter Date: 07/01/2021   PT End of Session - 07/01/21 0820     Visit Number 9    Number of Visits 12    Date for PT Re-Evaluation 07/18/21    PT Start Time 0730    PT Stop Time 0814    PT Time Calculation (min) 44 min    Activity Tolerance Patient tolerated treatment well    Behavior During Therapy [facility] for tasks assessed/performed             Past Medical History:  Diagnosis Date   Medical history non-contributory     Past Surgical History:  Procedure Laterality Date   ANTERIOR CERVICAL CORPECTOMY N/A 03/04/2021   Procedure: Cervical five Corpectomy;  Surgeon: Ashok Pall, MD;  Location: [place];  Service: Neurosurgery;  Laterality: N/A;   TONSILLECTOMY      There were no vitals filed for this visit.   Subjective Assessment - 07/01/21 0741     Subjective COVID-19 screen performed prior to patient entering clinic. Related more pain to the weather. Reports that he has needed his name more than ever the last few days due to instability.    Pertinent History Unremarkable.    How long can you sit comfortably? Unlimited.    How long can you walk comfortably? Around home, short community distances.    Patient Stated Goals Improve coordination and balance.    Currently in Pain? Yes    Pain Score 4     Pain Location Neck    Pain Orientation Posterior    Pain Descriptors / Indicators Discomfort    Pain Type Surgical pain    Pain Onset In the past 7 days    Pain Frequency Intermittent                [facility] PT Assessment - 07/01/21 0001       Assessment   Medical Diagnosis Unspecified cord compression.    Referring Provider (PT) Ashok Pall MD    Onset Date/Surgical  Date 03/04/21    Next MD Visit 08/09/2020      Precautions   Precautions Fall                           [facility] Adult PT Treatment/Exercise - 07/01/21 0001       Neck Exercises: Machines for Strengthening   Nustep L4 x15 min      Neck Exercises: Theraband   Scapula Retraction 20 reps;Limitations    Scapula Retraction Limitations Blue XTS      Lumbar Exercises: Machines for Strengthening   Cybex Knee Extension 10# 3x10 reps    Cybex Knee Flexion 30# 3x10 reps      Lumbar Exercises: Standing   Heel Raises 20 reps    Heel Raises Limitations B toe raise x20 reps      Knee/Hip Exercises: Standing   Hip Abduction Stengthening;Both;20 reps;Knee straight;Limitations    Abduction Limitations green theraband                 Balance Exercises - 07/01/21 0001       Balance Exercises: Standing   Standing Eyes Opened Narrow base of support (BOS);Foam/compliant surface;Limitations   LLE beam, RLE 4"  step to emphasize LLE WB with head, trunk turns   Numbers 1-15 Foam/compliant surface    Cone Rotation Foam/compliant surface;R/L    Other Standing Exercises DLS on BOSU x2 min    Other Standing Exercises Comments D2 on beam x15 reps                     PT Long Term Goals - 06/27/21 0850       PT LONG TERM GOAL #1   Title Independent with a HEP.    Time 8    Period Weeks    Status Achieved      PT LONG TERM GOAL #2   Title Improve Berg score to 50.    Time 8    Period Weeks    Status On-going      PT LONG TERM GOAL #3   Title Negative Romberg test.    Time 8    Period Weeks    Status On-going      PT LONG TERM GOAL #4   Title Walk a community distance without assistive device.    Status On-going                   Plan - 07/01/21 3428     Clinical Impression Statement Patient presented in clinic with reports of greater pain and a greater dependence on SPC in the last several days. Patient feels as if he is still leaning and  WBing more through RLE. All balance activities progressed to more functional technique and detailed technique with head and trunk turns. Reaching outside BOS also included for functional value on uneven surfaces.    Personal Factors and Comorbidities Profession;Other    Examination-Activity Limitations Other;Locomotion Level    Examination-Participation Restrictions Other    Stability/Clinical Decision Making Stable/Uncomplicated    Rehab Potential Good    PT Frequency 1x / week    PT Duration 8 weeks    PT Treatment/Interventions ADLs/Self Care Home Management;Electrical Stimulation;Moist Heat;Neuromuscular re-education;Balance training;Therapeutic exercise;Therapeutic activities;Functional mobility training;Stair training;Gait training;Patient/family education;Manual techniques    PT Next Visit Plan Work on corrdination, balanance, proprioception and gait activities.  Bilateral hip abduction strengthening.    Consulted and Agree with Plan of Care Patient             Patient will benefit from skilled therapeutic intervention in order to improve the following deficits and impairments:  Abnormal gait, Decreased activity tolerance, Decreased balance, Postural dysfunction, Other (comment), Pain, Increased muscle spasms  Visit Diagnosis: Unsteadiness on feet  Cervicalgia     Problem List Patient Active Problem List   Diagnosis Date Noted   Cervical stenosis of spinal canal 03/04/2021    Standley Brooking, PTA 07/01/2021, 8:27 AM  [facility] [address] [place], [place], [address] Phone: [phone]   Fax:  [phone]  Name: Mike Hodge MRN: [id number] Date of Birth: 01-15-1966   PHYSICAL THERAPY DISCHARGE SUMMARY  Visits from Start of Care: 9.  Current functional level related to goals / functional outcomes: See above.   Remaining deficits: See below.   Education / Equipment: HEP.   Patient agrees to discharge.  Patient goals were partially met. Patient is being discharged due to lack of progress.    Mali Applegate MPT

## 2021-08-09 DIAGNOSIS — Z6841 Body Mass Index (BMI) 40.0 and over, adult: Secondary | ICD-10-CM | POA: Diagnosis not present

## 2021-08-09 DIAGNOSIS — R030 Elevated blood-pressure reading, without diagnosis of hypertension: Secondary | ICD-10-CM | POA: Diagnosis not present

## 2021-08-09 DIAGNOSIS — G9520 Unspecified cord compression: Secondary | ICD-10-CM | POA: Diagnosis not present

## 2022-01-09 DIAGNOSIS — Z6841 Body Mass Index (BMI) 40.0 and over, adult: Secondary | ICD-10-CM | POA: Diagnosis not present

## 2022-01-09 DIAGNOSIS — G9520 Unspecified cord compression: Secondary | ICD-10-CM | POA: Diagnosis not present

## 2022-05-15 DIAGNOSIS — Z6839 Body mass index (BMI) 39.0-39.9, adult: Secondary | ICD-10-CM | POA: Diagnosis not present

## 2022-05-15 DIAGNOSIS — G9520 Unspecified cord compression: Secondary | ICD-10-CM | POA: Diagnosis not present

## 2022-05-24 ENCOUNTER — Other Ambulatory Visit: Payer: Self-pay | Admitting: Neurosurgery

## 2022-05-24 DIAGNOSIS — G9520 Unspecified cord compression: Secondary | ICD-10-CM

## 2022-06-06 ENCOUNTER — Ambulatory Visit
Admission: RE | Admit: 2022-06-06 | Discharge: 2022-06-06 | Disposition: A | Discharge status: Home / Self Care | Payer: 59 | Source: Ambulatory Visit | Attending: Neurosurgery | Admitting: Neurosurgery

## 2022-06-06 DIAGNOSIS — G9520 Unspecified cord compression: Secondary | ICD-10-CM

## 2022-06-06 DIAGNOSIS — M4802 Spinal stenosis, cervical region: Secondary | ICD-10-CM | POA: Diagnosis not present

## 2022-06-26 DIAGNOSIS — Z6838 Body mass index (BMI) 38.0-38.9, adult: Secondary | ICD-10-CM | POA: Diagnosis not present

## 2022-06-26 DIAGNOSIS — G9520 Unspecified cord compression: Secondary | ICD-10-CM | POA: Diagnosis not present

## 2022-10-23 DIAGNOSIS — M25561 Pain in right knee: Secondary | ICD-10-CM | POA: Diagnosis not present

## 2022-10-23 DIAGNOSIS — M170 Bilateral primary osteoarthritis of knee: Secondary | ICD-10-CM | POA: Diagnosis not present

## 2023-01-31 IMAGING — MR MR CERVICAL SPINE W/O CM
4 of 5 series · 26 of 48 positions shown · non-contrast
Comparison: No pertinent prior exams available for comparison.

CLINICAL DATA: Neck pain. Additional history provided by scanning
technologist: Patient reports right arm and hand numbness for 2-3
weeks, increased symptoms with neck flexion. Slight numbness in left
hand.

EXAM:
MRI CERVICAL SPINE WITHOUT CONTRAST
TECHNIQUE: Multiplanar, multisequence MR imaging of the cervical spine was
performed. No intravenous contrast was administered.

[Series 5: T2 · sagittal · 3.0mm · 0.55mm/px · 6 of 15 slices shown (1 of 2)]
[im 1/15]
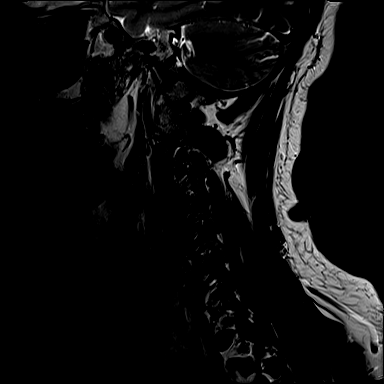
[im 3/15]
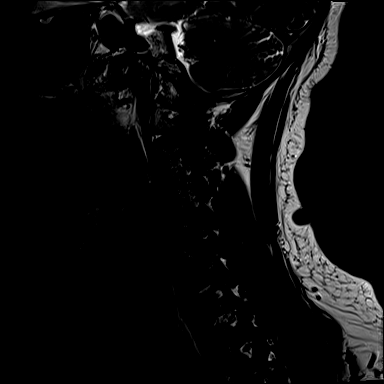
[im 6/15]
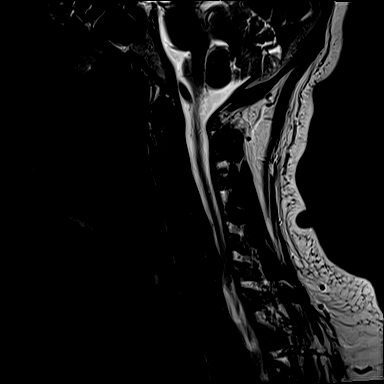
[im 9/15]
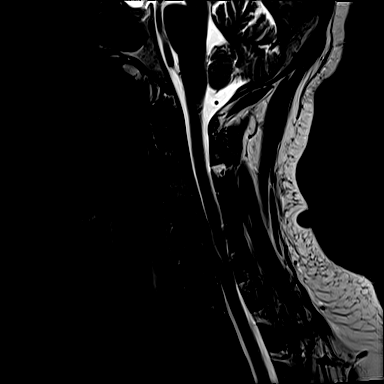
[im 12/15]
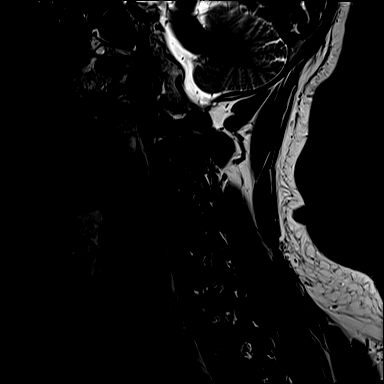
[im 15/15]
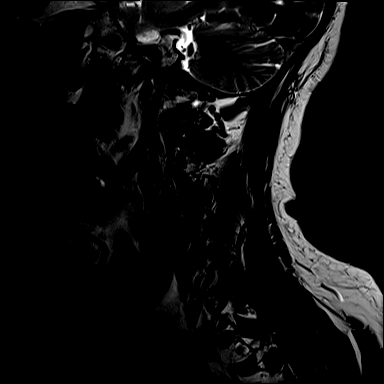

[Series 6: T1 · sagittal · 3.0mm · 0.66mm/px · 6 of 15 slices shown]
[im 1/15]
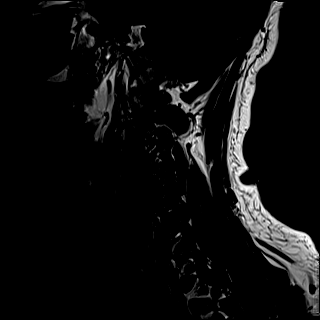
[im 3/15]
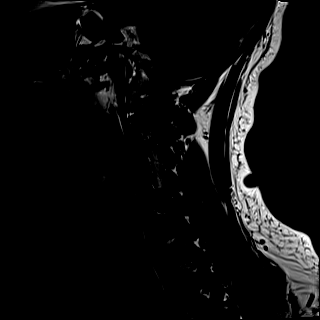
[im 6/15]
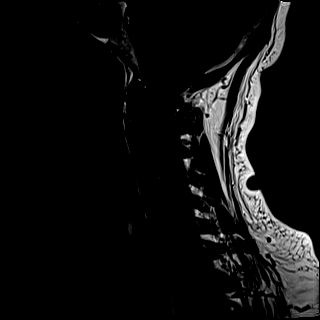
[im 9/15]
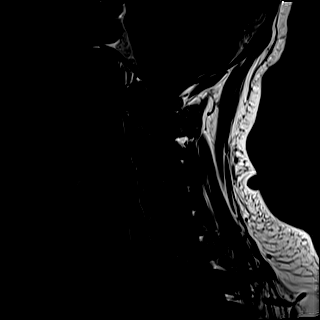
[im 12/15]
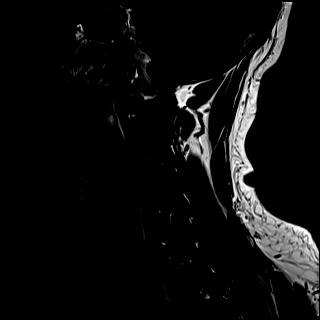
[im 15/15]
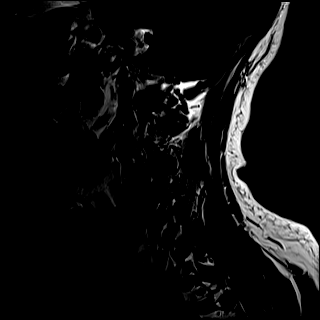

[Series 7: STIR · sagittal · 3.0mm · 0.33mm/px · 5 of 15 slices shown]
[im 1/15]
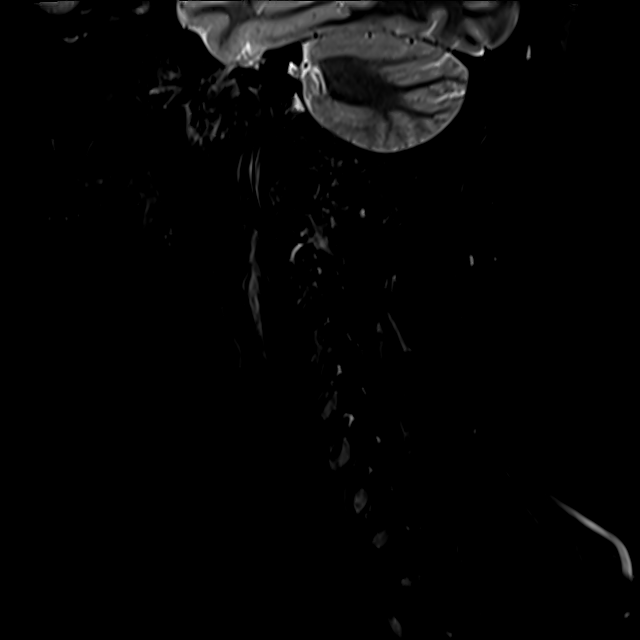
[im 3/15]
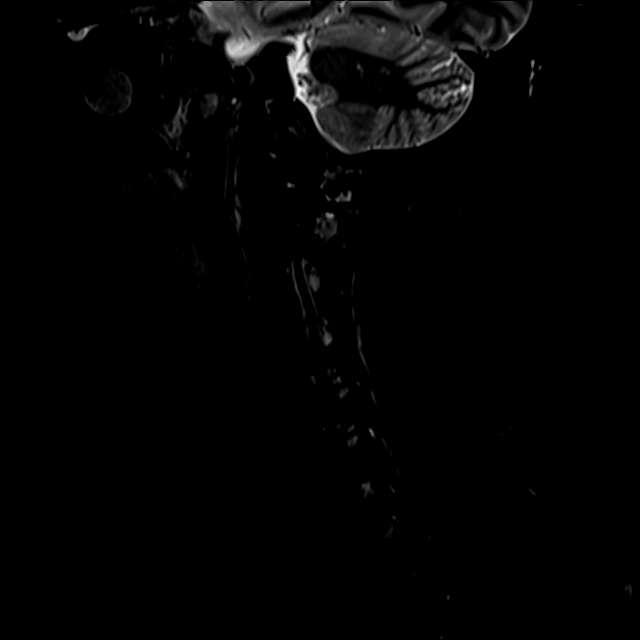
[im 6/15]
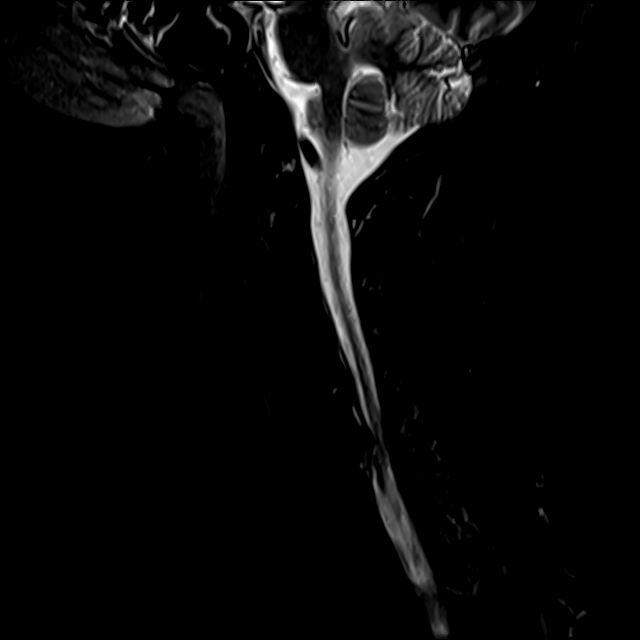
[im 9/15]
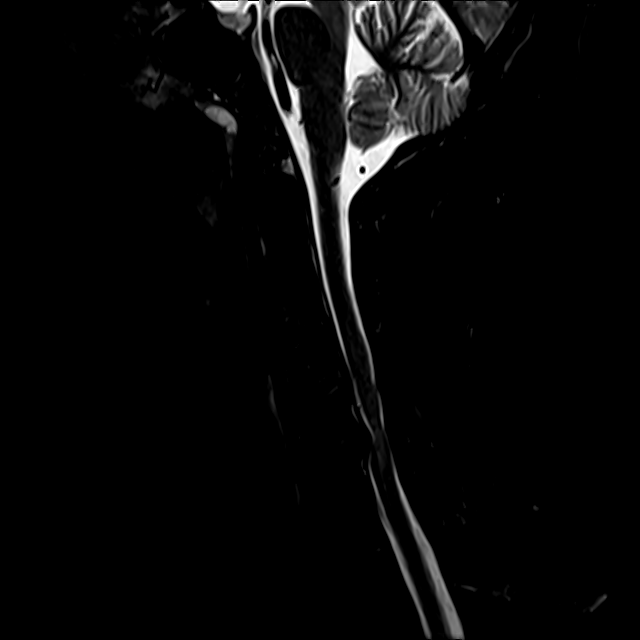
[im 15/15]
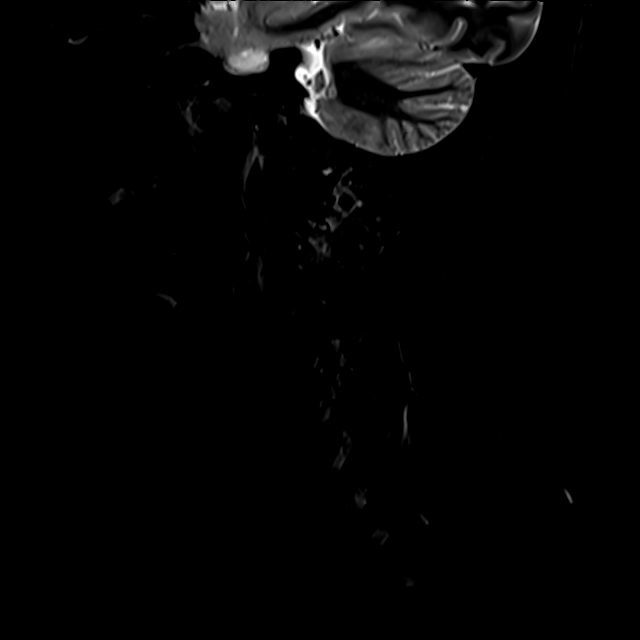

[Series 8: T2 · axial · 3.0mm · 0.50mm/px · z∈[-47,+61]mm · 9 of 35 slices shown (2 of 2)]
[im 1/35]
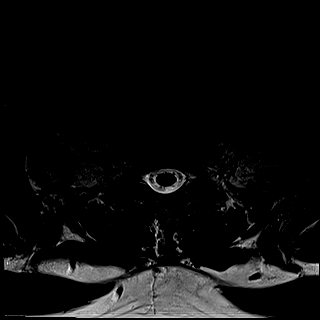
[im 5/35]
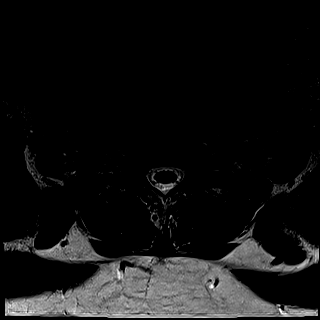
[im 10/35]
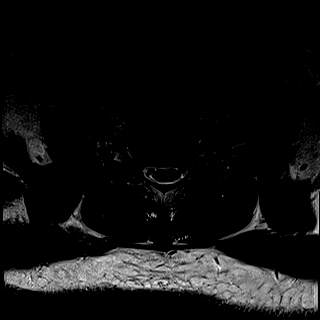
[im 15/35]
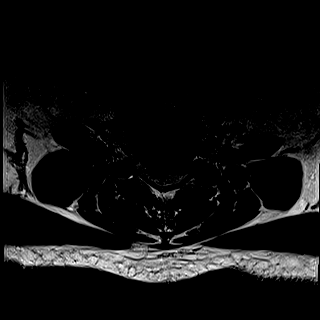
[im 18/35]
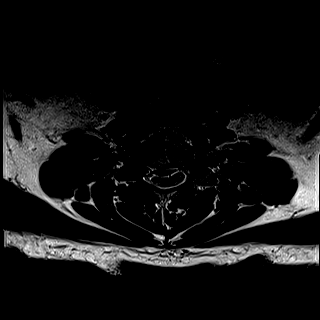
[im 20/35]
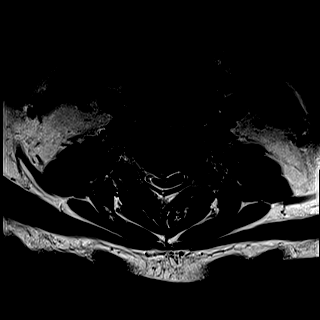
[im 25/35]
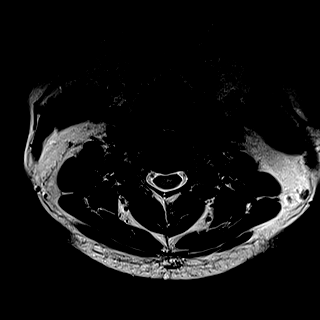
[im 30/35]
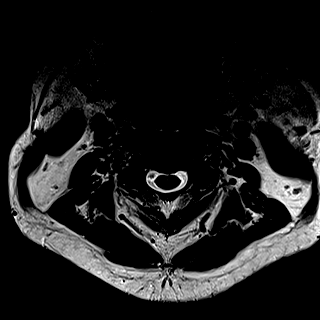
[im 35/35]
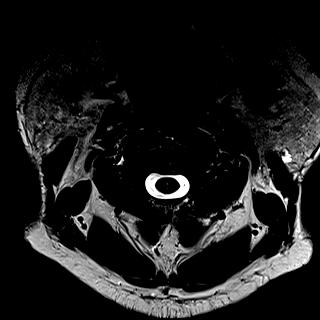

[26 of 48 positions shown; findings below may reference images not displayed]

FINDINGS: Alignment: Straightening of the expected cervical lordosis. Trace
C4-C5 grade 1 retrolisthesis. Trace grade 1 retrolisthesis.

Vertebrae: Vertebral body height is maintained. No significant
marrow edema or focal suspicious osseous lesion.

Cord: Spinal cord flattening at C4-C5 and C5-C6 as described below.
T2 hyperintense signal abnormality within the spinal cord at the
C5-C6 level, as described below.

Posterior Fossa, vertebral arteries, paraspinal tissues: No
abnormality identified within included portions of the posterior
fossa. Flow voids preserved within the imaged cervical vertebral
arteries. Paraspinal soft tissues within normal limits. Incidentally
noted 1.8 cm right maxillary sinus mucous retention cyst.

Disc levels:

Borderline congenitally narrow cervical spinal canal.

Mild multilevel disc degeneration (greatest at C4-C5 and C5-C6 (mild
to moderate at these levels).

C2-C3: No significant disc herniation or stenosis.

C3-C4: Shallow disc bulge with superimposed small central disc
protrusion. Mild partial effacement of the ventral thecal sac with
possible contact upon the ventral spinal cord. Mild/moderate right
neural foraminal narrowing.

C4-C5: Grade 1 retrolisthesis. Disc bulge with endplate spurring and
disc osteophyte ridge/uncinate hypertrophy. Superimposed
moderate-sized central disc extrusion with slight cranial migration.
Mild facet arthrosis. The disc extrusion contributes to severe
spinal canal stenosis, contacting and at least moderately flattening
the right aspect of the spinal cord (series 8, image 15). No
definite spinal cord signal abnormality is identified. Bilateral
neural foraminal narrowing (moderate right, mild/moderate left).

C5-C6: Trace grade 1 retrolisthesis. Disc bulge with endplate
spurring and bilateral disc osteophyte ridge/uncinate hypertrophy.
Superimposed broad-based central disc extrusion eccentric to the
right. The disc extrusion has migrated cephalad to the mid C5
vertebral body level. The disc extrusion also demonstrates mild
caudal migration. Severe spinal canal stenosis with marked spinal
cord flattening. T2 hyperintense signal abnormality within the
spinal cord at this level compatible with myelomalacia and/or focal
edema. The disc extrusion could also affect the exiting right C6
nerve root within the right foraminal entry zone. Background
mild-to-moderate right neural foraminal narrowing.

C6-C7: Shallow disc bulge eccentric to the right. No significant
spinal canal stenosis. Mild relative right neural foraminal
narrowing.

C7-T1: No significant disc herniation or stenosis.

Impression #2 and #3 will be called to the ordering clinician or
representative by the Radiologist Assistant, and communication
documented in the PACS or [REDACTED].
IMPRESSION: Cervical spondylosis, as outlined with findings most notably as
follows.

At C5-C6, a broad-based cranially and caudally migrated central disc
extrusion eccentric to the right contributes to multifactorial
severe spinal canal stenosis with marked spinal cord flattening. T2
hyperintense signal abnormality within the spinal cord at this level
compatible with myelomalacia and/or focal edema. The disc extrusion
could also affect the exiting right C6 nerve root within the right
foraminal entry zone. Background mild to moderate right neural
foraminal narrowing.

At C4-C5, a small to moderate-sized central disc extrusion
contributes to severe spinal canal stenosis with at least moderate
flattening of the right aspect of the spinal cord. Multifactorial
bilateral neural foraminal narrowing (moderate right, mild/moderate
left).

Sites of lesser spinal canal and foraminal stenosis, as described.

Mild C4-C5 and C5-C6 grade 1 retrolisthesis.

## 2024-03-26 DIAGNOSIS — M170 Bilateral primary osteoarthritis of knee: Secondary | ICD-10-CM | POA: Diagnosis not present

## 2024-04-02 ENCOUNTER — Telehealth: Payer: Self-pay | Admitting: *Deleted

## 2024-04-02 NOTE — Telephone Encounter (Signed)
 Received surgical clearance form from [facility] Dr Ernie Spear form back w/ note 'Has not been seen here since 2019, No longer a pt here'

## 2024-05-08 DIAGNOSIS — Z01818 Encounter for other preprocedural examination: Secondary | ICD-10-CM | POA: Diagnosis not present

## 2024-05-29 DIAGNOSIS — M25561 Pain in right knee: Secondary | ICD-10-CM | POA: Diagnosis not present

## 2024-06-10 DIAGNOSIS — R5383 Other fatigue: Secondary | ICD-10-CM | POA: Diagnosis not present

## 2024-06-10 DIAGNOSIS — Z125 Encounter for screening for malignant neoplasm of prostate: Secondary | ICD-10-CM | POA: Diagnosis not present

## 2024-06-10 DIAGNOSIS — E7800 Pure hypercholesterolemia, unspecified: Secondary | ICD-10-CM | POA: Diagnosis not present

## 2024-06-20 NOTE — Patient Instructions (Signed)
 SURGICAL WAITING ROOM VISITATION  Patients having surgery or a procedure may have no more than 2 support people in the waiting area - these visitors may rotate.    Children under the age of 51 must have an adult with them who is not the patient.  Visitors with respiratory illnesses are discouraged from visiting and should remain at home.  If the patient needs to stay at the hospital during part of their recovery, the visitor guidelines for inpatient rooms apply. Pre-op nurse will coordinate an appropriate time for 1 support person to accompany patient in pre-op.  This support person may not rotate.    Please refer to the [facility] website for the visitor guidelines for Inpatients (after your surgery is over and you are in a regular room).       Your procedure is scheduled on:   07/01/2024    Report to [facility] Main Entrance    Report to admitting at   0930AM   Call this number if you have problems the morning of surgery [phone]   Do not eat food :After Midnight.   After Midnight you may have the following liquids until __ 0900____ AM DAY OF SURGERY  Water Non-Citrus Juices (without pulp, NO RED-Apple, White grape, White cranberry) Black Coffee (NO MILK/CREAM OR CREAMERS, sugar ok)  Clear Tea (NO MILK/CREAM OR CREAMERS, sugar ok) regular and decaf                             Plain Jell-O (NO RED)                                           Fruit ices (not with fruit pulp, NO RED)                                     Popsicles (NO RED)                                                               Sports drinks like Gatorade (NO RED)                   The day of surgery:  Drink ONE (1) Pre-Surgery Clear Ensure or G2 at  0900 AM the morning of surgery. Drink in one sitting. Do not sip.  This drink was given to you during your hospital  pre-op appointment visit. Nothing else to drink after completing the  Pre-Surgery Clear Ensure or G2.          If you have  questions, please contact your surgeon's office.       Oral Hygiene is also important to reduce your risk of infection.                                    Remember - BRUSH YOUR TEETH THE MORNING OF SURGERY WITH YOUR REGULAR TOOTHPASTE  DENTURES WILL BE REMOVED PRIOR TO SURGERY PLEASE DO NOT APPLY Poly grip OR  ADHESIVES!!!   Do NOT smoke after Midnight   Stop all vitamins and herbal supplements 7 days before surgery.   Take these medicines the morning of surgery with A SIP OF WATER:  none   DO NOT TAKE ANY ORAL DIABETIC MEDICATIONS DAY OF YOUR SURGERY  Bring CPAP mask and tubing day of surgery.                              You may not have any metal on your body including hair pins, jewelry, and body piercing             Do not wear make-up, lotions, powders, perfumes/cologne, or deodorant  Do not wear nail polish including gel and S&S, artificial/acrylic nails, or any other type of covering on natural nails including finger and toenails. If you have artificial nails, gel coating, etc. that needs to be removed by a nail salon please have this removed prior to surgery or surgery may need to be canceled/ delayed if the surgeon/ anesthesia feels like they are unable to be safely monitored.   Do not shave  48 hours prior to surgery.               Men may shave face and neck.   Do not bring valuables to the hospital. [facility] IS NOT             RESPONSIBLE   FOR VALUABLES.   Contacts, glasses, dentures or bridgework may not be worn into surgery.   Bring small overnight bag day of surgery.   DO NOT BRING YOUR HOME MEDICATIONS TO THE HOSPITAL. PHARMACY WILL DISPENSE MEDICATIONS LISTED ON YOUR MEDICATION LIST TO YOU DURING YOUR ADMISSION IN THE HOSPITAL!    Patients discharged on the day of surgery will not be allowed to drive home.  Someone NEEDS to stay with you for the first 24 hours after anesthesia.   Special Instructions: Bring a copy of your healthcare power of attorney  and living will documents the day of surgery if you haven't scanned them before.              Please read over the following fact sheets you were given: IF YOU HAVE QUESTIONS ABOUT YOUR PRE-OP INSTRUCTIONS PLEASE CALL [phone].   If you received a COVID test during your pre-op visit  it is requested that you wear a mask when out in public, stay away from anyone that may not be feeling well and notify your surgeon if you develop symptoms. If you test positive for Covid or have been in contact with anyone that has tested positive in the last 10 days please notify you surgeon.      Pre-operative 4 CHG Bath Instructions   You can play a key role in reducing the risk of infection after surgery. Your skin needs to be as free of germs as possible. You can reduce the number of germs on your skin by washing with CHG (chlorhexidine  gluconate) soap before surgery. CHG is an antiseptic soap that kills germs and continues to kill germs even after washing.   DO NOT use if you have an allergy to chlorhexidine /CHG or antibacterial soaps. If your skin becomes reddened or irritated, stop using the CHG and notify one of our RNs at [phone].   Please shower with the CHG soap starting 4 days before surgery using the following schedule:     Please keep in mind the following:  DO NOT shave, including legs and underarms, starting the day of your first shower.   You may shave your face at any point before/day of surgery.  Place clean sheets on your bed the day you start using CHG soap. Use a clean washcloth (not used since being washed) for each shower. DO NOT sleep with pets once you start using the CHG.   CHG Shower Instructions:  If you choose to wash your hair and private area, wash first with your normal shampoo/soap.  After you use shampoo/soap, rinse your hair and body thoroughly to remove shampoo/soap residue.  Turn the water OFF and apply about 3 tablespoons (45 ml) of CHG soap to a CLEAN washcloth.   Apply CHG soap ONLY FROM YOUR NECK DOWN TO YOUR TOES (washing for 3-5 minutes)  DO NOT use CHG soap on face, private areas, open wounds, or sores.  Pay special attention to the area where your surgery is being performed.  If you are having back surgery, having someone wash your back for you may be helpful. Wait 2 minutes after CHG soap is applied, then you may rinse off the CHG soap.  Pat dry with a clean towel  Put on clean clothes/pajamas   If you choose to wear lotion, please use ONLY the CHG-compatible lotions on the back of this paper.     Additional instructions for the day of surgery: DO NOT APPLY any lotions, deodorants, cologne, or perfumes.   Put on clean/comfortable clothes.  Brush your teeth.  Ask your nurse before applying any prescription medications to the skin.      CHG Compatible Lotions   Aveeno Moisturizing lotion  Cetaphil Moisturizing Cream  Cetaphil Moisturizing Lotion  Clairol Herbal Essence Moisturizing Lotion, Dry Skin  Clairol Herbal Essence Moisturizing Lotion, Extra Dry Skin  Clairol Herbal Essence Moisturizing Lotion, Normal Skin  Curel Age Defying Therapeutic Moisturizing Lotion with Alpha Hydroxy  Curel Extreme Care Body Lotion  Curel Soothing Hands Moisturizing Hand Lotion  Curel Therapeutic Moisturizing Cream, Fragrance-Free  Curel Therapeutic Moisturizing Lotion, Fragrance-Free  Curel Therapeutic Moisturizing Lotion, Original Formula  Eucerin Daily Replenishing Lotion  Eucerin Dry Skin Therapy Plus Alpha Hydroxy Crme  Eucerin Dry Skin Therapy Plus Alpha Hydroxy Lotion  Eucerin Original Crme  Eucerin Original Lotion  Eucerin Plus Crme Eucerin Plus Lotion  Eucerin TriLipid Replenishing Lotion  Keri Anti-Bacterial Hand Lotion  Keri Deep Conditioning Original Lotion Dry Skin Formula Softly Scented  Keri Deep Conditioning Original Lotion, Fragrance Free Sensitive Skin Formula  Keri Lotion Fast Absorbing Fragrance Free Sensitive Skin  Formula  Keri Lotion Fast Absorbing Softly Scented Dry Skin Formula  Keri Original Lotion  Keri Skin Renewal Lotion Keri Silky Smooth Lotion  Keri Silky Smooth Sensitive Skin Lotion  Nivea Body Creamy Conditioning Oil  Nivea Body Extra Enriched Teacher, Adult Education Moisturizing Lotion Nivea Crme  Nivea Skin Firming Lotion  NutraDerm 30 Skin Lotion  NutraDerm Skin Lotion  NutraDerm Therapeutic Skin Cream  NutraDerm Therapeutic Skin Lotion  ProShield Protective Hand Cream  Provon moisturizing lotion

## 2024-06-20 NOTE — Progress Notes (Addendum)
 Anesthesia Review:  PCP: [facility]  PT states at preop he had labs and ov last week at PCP in prep for surgery.  Called and requested ov note, EKG and labs .  [facility]  [phone].  Received OV note from 06/10/24 on 06/23/24.  Did not  received lab  results.  Called back on 06/23/2024 and [facility] and rerequested lab results.   Cardiologist : none   PPM/ ICD: Device Orders: Rep Notified:  Chest x-ray : EKG :06/10/24 in Media Tab dated 06/23/2024  Echo : Stress test: Cardiac Cath :   Activity level: can do a flight of stairs without difficutly  Sleep Study/ CPAP : Fasting Blood Sugar :      / Checks Blood Sugar -- times a day:     Prediabetes- does not check glucose at home on no meds   Blood Thinner/ Instructions /Last Dose: ASA / Instructions/ Last Dose :    08/12/24- left knee

## 2024-06-23 ENCOUNTER — Other Ambulatory Visit: Payer: Self-pay

## 2024-06-23 ENCOUNTER — Encounter (HOSPITAL_COMMUNITY)
Admission: RE | Admit: 2024-06-23 | Discharge: 2024-06-23 | Disposition: A | Source: Ambulatory Visit | Attending: Orthopedic Surgery

## 2024-06-23 ENCOUNTER — Encounter (HOSPITAL_COMMUNITY): Payer: Self-pay

## 2024-06-23 VITALS — BP 149/98 | HR 76 | Temp 98.5°F | Resp 16 | Ht 72.0 in | Wt 278.0 lb

## 2024-06-23 DIAGNOSIS — Z01818 Encounter for other preprocedural examination: Secondary | ICD-10-CM

## 2024-06-23 HISTORY — DX: Prediabetes: R73.03

## 2024-06-23 HISTORY — DX: Unspecified osteoarthritis, unspecified site: M19.90

## 2024-06-23 LAB — SURGICAL PCR SCREEN
MRSA, PCR: NEGATIVE
Staphylococcus aureus: NEGATIVE

## 2024-06-23 NOTE — H&P (Signed)
 TOTAL KNEE ADMISSION H&P  Patient is being admitted for right total knee arthroplasty.  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  HPI: Mike Elsie Inetta Mickey., 58 y.o. male, has a history of pain and functional disability in the right knee due to arthritis and has failed non-surgical conservative treatments for greater than 12 weeks to includeNSAID's and/or analgesics, corticosteriod injections, and activity modification.  Onset of symptoms was gradual, starting 2 years ago with gradually worsening course since that time. The patient noted no past surgery on the right knee(s).  Patient currently rates pain in the right knee(s) at 8 out of 10 with activity. Patient has worsening of pain with activity and weight bearing and pain that interferes with activities of daily living.  Patient has evidence of joint space narrowing by imaging studies. here is no active infection.  Patient Active Problem List   Diagnosis Date Noted   Cervical stenosis of spinal canal 03/04/2021   Past Medical History:  Diagnosis Date   Arthritis    Medical history non-contributory    Pre-diabetes     Past Surgical History:  Procedure Laterality Date   ANTERIOR CERVICAL CORPECTOMY N/A 03/04/2021   Procedure: Cervical five Corpectomy;  Surgeon: Gillie Duncans, MD;  Location: [facility] OR;  Service: Neurosurgery;  Laterality: N/A;   TONSILLECTOMY      No current facility-administered medications for this encounter.   Current Outpatient Medications  Medication Sig Dispense Refill Last Dose/Taking    diclofenac (VOLTAREN) 75 MG EC tablet Take 75 mg by mouth 2 (two) times daily with a meal.   Taking   ibuprofen (ADVIL) 200 MG tablet Take 400 mg by mouth daily in the afternoon.   Taking   No Known Allergies  Social History   Tobacco Use   Smoking status: Every Day    Current packs/day: 0.25    Average packs/day: 0.3 packs/day for 30.0 years (7.5 ttl pk-yrs)    Types: Cigarettes   Smokeless tobacco: Never  Substance Use Topics   Alcohol use: Yes    Alcohol/week: 2.0 standard drinks of alcohol    Types: 2 Cans of beer per week    Comment: 2 beers per week    Family History  Problem Relation Age of Onset   Cancer Mother        lung   Cancer Father        prostate     Review of Systems  Constitutional:  Negative for chills and fever.  Respiratory:  Negative for cough and shortness of breath.   Cardiovascular:  Negative for chest pain.  Gastrointestinal:  Negative for nausea and vomiting.  Musculoskeletal:  Positive for arthralgias.     Objective:  Physical Exam Bilateral knee exams: No palpable effusions, warmth erythema Bilateral genu varum Slight flexion contractures with flexion over 110 degrees with tightness and crepitation over the anterior medial aspect the knees Stable medial and lateral collateral ligaments No significant lower extremity edema, erythema or calf tenderness  Vital signs in last 24 hours: Temp:  [98.5 F (36.9 C)] 98.5 F (36.9 C) (12/08 1128)  Pulse Rate:  [76] 76 (12/08 1128) Resp:  [16] 16 (12/08 1128) BP: (149)/(98) 149/98 (12/08 1128) SpO2:  [98 %] 98 % (12/08 1128) Weight:  [126.1 kg] 126.1 kg (12/08 1128)  Labs:   Estimated body mass index is 37.7 kg/m as calculated from the following:   Height as of 06/23/24: 6' (1.829 m).   Weight as of 06/23/24: 126.1 kg.   Imaging Review Plain radiographs demonstrate severe degenerative joint disease of the right knee(s). The overall alignment isneutral. The bone quality appears to be  adequate for age and reported activity level.      Assessment/Plan:  End stage arthritis, right knee   The patient history, physical examination, clinical judgment of the provider and imaging studies are consistent with end stage degenerative joint disease of the right knee(s) and total knee arthroplasty is deemed medically necessary. The treatment options including medical management, injection therapy arthroscopy and arthroplasty were discussed at length. The risks and benefits of total knee arthroplasty were presented and reviewed. The risks due to aseptic loosening, infection, stiffness, patella tracking problems, thromboembolic complications and other imponderables were discussed. The patient acknowledged the explanation, agreed to proceed with the plan and consent was signed. Patient is being admitted for inpatient treatment for surgery, pain control, PT, OT, prophylactic antibiotics, VTE prophylaxis, progressive ambulation and ADL's and discharge planning. The patient is planning to be discharged home.     Patient's anticipated LOS is less than 2 midnights, meeting these requirements: - Younger than 32 - Lives within 1 hour of care - Has a competent adult at home to recover with post-op recover - NO history of  - Chronic pain requiring opiods  - Diabetes  - Coronary Artery Disease  - Heart failure  - Heart attack  - Stroke  - DVT/VTE  - Cardiac arrhythmia  - Respiratory Failure/COPD  - Renal failure  - Anemia  - Advanced Liver disease  Rosina Calin, PA-C Orthopedic Surgery [facility] [place] [phone]

## 2024-06-30 NOTE — Anesthesia Preprocedure Evaluation (Signed)
 Anesthesia Evaluation  Patient identified by MRN, date of birth, ID band Patient awake    Reviewed: Allergy & Precautions, NPO status , Patient's Chart, lab work & pertinent test results  Airway Mallampati: IV  TM Distance: >3 FB Neck ROM: Full    Dental no notable dental hx. (+) Teeth Intact, Dental Advisory Given   Pulmonary Current Smoker 5 cigg/d, no inhalers Snores at night, no sleep study    Pulmonary exam normal breath sounds clear to auscultation       Cardiovascular negative cardio ROS Normal cardiovascular exam Rhythm:Regular Rate:Normal     Neuro/Psych  negative psych ROS   GI/Hepatic negative GI ROS, Neg liver ROS,,,  Endo/Other  Obesity bMI 38 prediabetic  Renal/GU negative Renal ROS  negative genitourinary   Musculoskeletal  (+) Arthritis , Osteoarthritis,    Abdominal  (+) + obese  Peds  Hematology negative hematology ROS (+) Hb 15.4, plt 285   Anesthesia Other Findings   Reproductive/Obstetrics negative OB ROS                              Anesthesia Physical Anesthesia Plan  ASA: 2  Anesthesia Plan: MAC, Regional and Spinal   Post-op Pain Management: Regional block* and Tylenol  PO (pre-op)*   Induction:   PONV Risk Score and Plan: 2 and Propofol  infusion and TIVA  Airway Management Planned: Natural Airway and Simple Face Mask  Additional Equipment: None  Intra-op Plan:   Post-operative Plan:   Informed Consent: I have reviewed the patients History and Physical, chart, labs and discussed the procedure including the risks, benefits and alternatives for the proposed anesthesia with the patient or authorized representative who has indicated his/her understanding and acceptance.       Plan Discussed with: CRNA  Anesthesia Plan Comments:          Anesthesia Quick Evaluation

## 2024-07-01 ENCOUNTER — Encounter (HOSPITAL_COMMUNITY): Payer: Self-pay | Admitting: Orthopedic Surgery

## 2024-07-01 ENCOUNTER — Ambulatory Visit (HOSPITAL_COMMUNITY): Payer: Self-pay | Admitting: Medical

## 2024-07-01 ENCOUNTER — Other Ambulatory Visit: Payer: Self-pay

## 2024-07-01 ENCOUNTER — Encounter (HOSPITAL_COMMUNITY): Admission: RE | Disposition: A | Payer: Self-pay | Source: Ambulatory Visit | Attending: Orthopedic Surgery

## 2024-07-01 ENCOUNTER — Ambulatory Visit (HOSPITAL_COMMUNITY): Payer: Self-pay | Admitting: Anesthesiology

## 2024-07-01 ENCOUNTER — Observation Stay (HOSPITAL_COMMUNITY)
Admission: RE | Admit: 2024-07-01 | Discharge: 2024-07-02 | Disposition: A | Discharge status: Home / Self Care | Payer: Self-pay | Source: Ambulatory Visit | Attending: Orthopedic Surgery | Admitting: Orthopedic Surgery

## 2024-07-01 DIAGNOSIS — F17210 Nicotine dependence, cigarettes, uncomplicated: Secondary | ICD-10-CM | POA: Insufficient documentation

## 2024-07-01 DIAGNOSIS — M1711 Unilateral primary osteoarthritis, right knee: Principal | ICD-10-CM | POA: Insufficient documentation

## 2024-07-01 DIAGNOSIS — M25561 Pain in right knee: Secondary | ICD-10-CM | POA: Diagnosis present

## 2024-07-01 DIAGNOSIS — Z96651 Presence of right artificial knee joint: Principal | ICD-10-CM

## 2024-07-01 DIAGNOSIS — Z01818 Encounter for other preprocedural examination: Secondary | ICD-10-CM

## 2024-07-01 HISTORY — PX: TOTAL KNEE ARTHROPLASTY: SHX125

## 2024-07-01 LAB — CBC
HCT: 46.9 % (ref 39.0–52.0)
Hemoglobin: 15.4 g/dL (ref 13.0–17.0)
MCH: 29.4 pg (ref 26.0–34.0)
MCHC: 32.8 g/dL (ref 30.0–36.0)
MCV: 89.7 fL (ref 80.0–100.0)
Platelets: 285 K/uL (ref 150–400)
RBC: 5.23 MIL/uL (ref 4.22–5.81)
RDW: 12.2 % (ref 11.5–15.5)
WBC: 8 K/uL (ref 4.0–10.5)
nRBC: 0 % (ref 0.0–0.2)

## 2024-07-01 SURGERY — ARTHROPLASTY, KNEE, TOTAL
Anesthesia: Monitor Anesthesia Care | Site: Knee | Laterality: Right

## 2024-07-01 MED ORDER — METHOCARBAMOL 1000 MG/10ML IJ SOLN: 500.0000 mg | Freq: Four times a day (QID) | INTRAMUSCULAR | Status: DC | PRN

## 2024-07-01 MED ORDER — ACETAMINOPHEN 500 MG PO TABS
1000.0000 mg | ORAL_TABLET | Freq: Four times a day (QID) | ORAL | Status: DC
  Administered 2024-07-01 – 2024-07-02 (×3): 1000 mg via ORAL
  Filled 2024-07-01 (×3): qty 2

## 2024-07-01 MED ORDER — METOCLOPRAMIDE HCL 5 MG/ML IJ SOLN: 5.0000 mg | Freq: Three times a day (TID) | INTRAMUSCULAR | Status: DC | PRN

## 2024-07-01 MED ORDER — ORAL CARE MOUTH RINSE: 15.0000 mL | Freq: Once | OROMUCOSAL | Status: DC

## 2024-07-01 MED ORDER — SODIUM CHLORIDE 0.9 % IV SOLN: INTRAVENOUS | Status: DC

## 2024-07-01 MED ORDER — CELECOXIB 200 MG PO CAPS
200.0000 mg | ORAL_CAPSULE | Freq: Two times a day (BID) | ORAL | Status: DC
  Administered 2024-07-01 – 2024-07-02 (×2): 200 mg via ORAL
  Filled 2024-07-01 (×2): qty 1

## 2024-07-01 MED ORDER — DEXAMETHASONE SOD PHOSPHATE PF 10 MG/ML IJ SOLN: 8.0000 mg | Freq: Once | INTRAMUSCULAR | Status: DC

## 2024-07-01 MED ORDER — DEXAMETHASONE SOD PHOSPHATE PF 10 MG/ML IJ SOLN
10.0000 mg | Freq: Once | INTRAMUSCULAR | Status: AC
  Administered 2024-07-02: 09:00:00 10 mg via INTRAVENOUS

## 2024-07-01 MED ORDER — ONDANSETRON HCL 4 MG PO TABS: 4.0000 mg | ORAL_TABLET | Freq: Four times a day (QID) | ORAL | Status: DC | PRN

## 2024-07-01 MED ORDER — ACETAMINOPHEN 500 MG PO TABS
1000.0000 mg | ORAL_TABLET | Freq: Once | ORAL | Status: DC
  Filled 2024-07-01: qty 2

## 2024-07-01 MED ORDER — MEPIVACAINE HCL (PF) 2 % IJ SOLN
INTRAMUSCULAR | Status: DC | PRN
  Administered 2024-07-01: 12:00:00 3.2 mL via INTRATHECAL

## 2024-07-01 MED ORDER — ONDANSETRON HCL 4 MG/2ML IJ SOLN: 4.0000 mg | Freq: Four times a day (QID) | INTRAMUSCULAR | Status: DC | PRN

## 2024-07-01 MED ORDER — ALUM & MAG HYDROXIDE-SIMETH 200-200-20 MG/5ML PO SUSP: 30.0000 mL | ORAL | Status: DC | PRN

## 2024-07-01 MED ORDER — CHLORHEXIDINE GLUCONATE 0.12 % MT SOLN: 15.0000 mL | Freq: Once | OROMUCOSAL | Status: DC

## 2024-07-01 MED ORDER — CEFAZOLIN SODIUM-DEXTROSE 2-4 GM/100ML-% IV SOLN
2.0000 g | Freq: Four times a day (QID) | INTRAVENOUS | Status: AC
  Administered 2024-07-01 – 2024-07-02 (×2): 2 g via INTRAVENOUS
  Filled 2024-07-01 (×2): qty 100

## 2024-07-01 MED ORDER — LACTATED RINGERS IV SOLN: INTRAVENOUS | Status: DC

## 2024-07-01 MED ORDER — SODIUM CHLORIDE 0.9 % IR SOLN
Status: DC | PRN
  Administered 2024-07-01: 13:00:00 1000 mL

## 2024-07-01 MED ORDER — TRANEXAMIC ACID-NACL 1000-0.7 MG/100ML-% IV SOLN
1000.0000 mg | Freq: Once | INTRAVENOUS | Status: AC
  Administered 2024-07-01: 16:00:00 1000 mg via INTRAVENOUS
  Filled 2024-07-01: qty 100

## 2024-07-01 MED ORDER — ONDANSETRON HCL 4 MG/2ML IJ SOLN: 4.0000 mg | Freq: Once | INTRAMUSCULAR | Status: DC | PRN

## 2024-07-01 MED ORDER — CEFAZOLIN SODIUM-DEXTROSE 3-4 GM/150ML-% IV SOLN
3.0000 g | INTRAVENOUS | Status: AC
  Administered 2024-07-01: 12:00:00 3 g via INTRAVENOUS
  Filled 2024-07-01: qty 150

## 2024-07-01 MED ORDER — KETOROLAC TROMETHAMINE 30 MG/ML IJ SOLN
INTRAMUSCULAR | Status: AC
  Filled 2024-07-01: qty 1

## 2024-07-01 MED ORDER — HYDROMORPHONE HCL 1 MG/ML IJ SOLN: 0.2500 mg | INTRAMUSCULAR | Status: DC | PRN

## 2024-07-01 MED ORDER — OXYCODONE HCL 5 MG PO TABS: 10.0000 mg | ORAL_TABLET | ORAL | Status: DC | PRN

## 2024-07-01 MED ORDER — AMISULPRIDE (ANTIEMETIC) 5 MG/2ML IV SOLN: 10.0000 mg | Freq: Once | INTRAVENOUS | Status: DC | PRN

## 2024-07-01 MED ORDER — PROPOFOL 10 MG/ML IV BOLUS
INTRAVENOUS | Status: AC
  Filled 2024-07-01: qty 20

## 2024-07-01 MED ORDER — POLYETHYLENE GLYCOL 3350 17 G PO PACK
17.0000 g | PACK | Freq: Two times a day (BID) | ORAL | Status: DC
  Filled 2024-07-01: qty 1

## 2024-07-01 MED ORDER — DIPHENHYDRAMINE HCL 12.5 MG/5ML PO ELIX: 12.5000 mg | ORAL_SOLUTION | ORAL | Status: DC | PRN

## 2024-07-01 MED ORDER — OXYCODONE HCL 5 MG PO TABS: 5.0000 mg | ORAL_TABLET | Freq: Once | ORAL | Status: DC | PRN

## 2024-07-01 MED ORDER — MIDAZOLAM HCL (PF) 2 MG/2ML IJ SOLN
2.0000 mg | INTRAMUSCULAR | Status: DC
  Administered 2024-07-01: 11:00:00 2 mg via INTRAVENOUS
  Filled 2024-07-01: qty 2

## 2024-07-01 MED ORDER — OXYCODONE HCL 5 MG PO TABS
5.0000 mg | ORAL_TABLET | ORAL | Status: DC | PRN
  Administered 2024-07-01 – 2024-07-02 (×2): 10 mg via ORAL
  Filled 2024-07-01 (×2): qty 2

## 2024-07-01 MED ORDER — PROPOFOL 1000 MG/100ML IV EMUL
INTRAVENOUS | Status: AC
  Filled 2024-07-01: qty 100

## 2024-07-01 MED ORDER — ASPIRIN 81 MG PO CHEW
81.0000 mg | CHEWABLE_TABLET | Freq: Two times a day (BID) | ORAL | Status: DC
  Administered 2024-07-01 – 2024-07-02 (×2): 81 mg via ORAL
  Filled 2024-07-01 (×2): qty 1

## 2024-07-01 MED ORDER — METHOCARBAMOL 500 MG PO TABS: 500.0000 mg | ORAL_TABLET | Freq: Four times a day (QID) | ORAL | Status: DC | PRN

## 2024-07-01 MED ORDER — SODIUM CHLORIDE (PF) 0.9 % IJ SOLN
INTRAMUSCULAR | Status: AC
  Filled 2024-07-01: qty 30

## 2024-07-01 MED ORDER — HYDROMORPHONE HCL 1 MG/ML IJ SOLN: 0.5000 mg | INTRAMUSCULAR | Status: DC | PRN

## 2024-07-01 MED ORDER — PHENOL 1.4 % MT LIQD: 1.0000 | OROMUCOSAL | Status: DC | PRN

## 2024-07-01 MED ORDER — BISACODYL 10 MG RE SUPP: 10.0000 mg | Freq: Every day | RECTAL | Status: DC | PRN

## 2024-07-01 MED ORDER — TRANEXAMIC ACID-NACL 1000-0.7 MG/100ML-% IV SOLN
1000.0000 mg | INTRAVENOUS | Status: AC
  Administered 2024-07-01: 13:00:00 1000 mg via INTRAVENOUS
  Filled 2024-07-01: qty 100

## 2024-07-01 MED ORDER — DEXAMETHASONE SOD PHOSPHATE PF 10 MG/ML IJ SOLN
INTRAMUSCULAR | Status: DC | PRN
  Administered 2024-07-01: 11:00:00 10 mg via PERINEURAL

## 2024-07-01 MED ORDER — MENTHOL 3 MG MT LOZG: 1.0000 | LOZENGE | OROMUCOSAL | Status: DC | PRN

## 2024-07-01 MED ORDER — STERILE WATER FOR IRRIGATION IR SOLN
Status: DC | PRN
  Administered 2024-07-01: 13:00:00 2000 mL

## 2024-07-01 MED ORDER — 0.9 % SODIUM CHLORIDE (POUR BTL) OPTIME
TOPICAL | Status: DC | PRN
  Administered 2024-07-01: 13:00:00 1000 mL

## 2024-07-01 MED ORDER — OXYCODONE HCL 5 MG/5ML PO SOLN: 5.0000 mg | Freq: Once | ORAL | Status: DC | PRN

## 2024-07-01 MED ORDER — METOCLOPRAMIDE HCL 5 MG PO TABS: 5.0000 mg | ORAL_TABLET | Freq: Three times a day (TID) | ORAL | Status: DC | PRN

## 2024-07-01 MED ORDER — MEPERIDINE HCL 25 MG/ML IJ SOLN: 6.2500 mg | INTRAMUSCULAR | Status: DC | PRN

## 2024-07-01 MED ORDER — SENNA 8.6 MG PO TABS: 2.0000 | ORAL_TABLET | Freq: Every day | ORAL | Status: DC

## 2024-07-01 MED ORDER — FENTANYL CITRATE (PF) 50 MCG/ML IJ SOSY
100.0000 ug | PREFILLED_SYRINGE | INTRAMUSCULAR | Status: DC
  Administered 2024-07-01: 11:00:00 100 ug via INTRAVENOUS
  Filled 2024-07-01: qty 2

## 2024-07-01 MED ORDER — POVIDONE-IODINE 10 % EX SWAB: 2.0000 | Freq: Once | CUTANEOUS | Status: DC

## 2024-07-01 MED ORDER — ACETAMINOPHEN 325 MG PO TABS: 325.0000 mg | ORAL_TABLET | Freq: Four times a day (QID) | ORAL | Status: DC | PRN

## 2024-07-01 MED ORDER — ROPIVACAINE HCL 5 MG/ML IJ SOLN
INTRAMUSCULAR | Status: DC | PRN
  Administered 2024-07-01: 11:00:00 30 mL via PERINEURAL

## 2024-07-01 MED ORDER — KETOROLAC TROMETHAMINE 30 MG/ML IJ SOLN: 30.0000 mg | Freq: Once | INTRAMUSCULAR | Status: DC | PRN

## 2024-07-01 MED ORDER — SODIUM CHLORIDE (PF) 0.9 % IJ SOLN
INTRAMUSCULAR | Status: DC | PRN
  Administered 2024-07-01: 13:00:00 61 mL

## 2024-07-01 MED ORDER — BUPIVACAINE-EPINEPHRINE (PF) 0.25% -1:200000 IJ SOLN
INTRAMUSCULAR | Status: AC
  Filled 2024-07-01: qty 30

## 2024-07-01 MED ORDER — PROPOFOL 500 MG/50ML IV EMUL
INTRAVENOUS | Status: DC | PRN
  Administered 2024-07-01: 12:00:00 150 ug/kg/min via INTRAVENOUS
  Administered 2024-07-01: 12:00:00 30 mg via INTRAVENOUS
  Administered 2024-07-01: 12:00:00 20 mg via INTRAVENOUS

## 2024-07-01 SURGICAL SUPPLY — 47 items
ATTUNE MED ANAT PAT 38 KNEE (Knees) IMPLANT
BAG COUNTER SPONGE SURGICOUNT (BAG) IMPLANT
BAG ZIPLOCK 12X15 (MISCELLANEOUS) ×1 IMPLANT
BASEPLATE TIB CEM ATTUNE SZ7 (Knees) IMPLANT
BLADE SAW SGTL 11.0X1.19X90.0M (BLADE) IMPLANT
BLADE SAW SGTL 13.0X1.19X90.0M (BLADE) ×1 IMPLANT
BNDG ELASTIC 6INX 5YD STR LF (GAUZE/BANDAGES/DRESSINGS) ×1 IMPLANT
BOWL SMART MIX CTS (DISPOSABLE) ×1 IMPLANT
CEMENT HV SMART SET (Cement) ×2 IMPLANT
COMPONENT FEM CMT ATTUNE RT 6 (Joint) IMPLANT
COOLER ICEMAN CLASSIC (MISCELLANEOUS) IMPLANT
COVER SURGICAL LIGHT HANDLE (MISCELLANEOUS) ×1 IMPLANT
DERMABOND ADVANCED .7 DNX12 (GAUZE/BANDAGES/DRESSINGS) ×1 IMPLANT
DRAPE U-SHAPE 47X51 STRL (DRAPES) ×1 IMPLANT
DRESSING AQUACEL AG SP 3.5X10 (GAUZE/BANDAGES/DRESSINGS) ×1 IMPLANT
DURAPREP 26ML APPLICATOR (WOUND CARE) ×2 IMPLANT
ELECT REM PT RETURN 15FT ADLT (MISCELLANEOUS) ×1 IMPLANT
GLOVE BIO SURGEON STRL SZ 6 (GLOVE) ×1 IMPLANT
GLOVE BIOGEL PI IND STRL 6.5 (GLOVE) ×1 IMPLANT
GLOVE BIOGEL PI IND STRL 7.5 (GLOVE) ×1 IMPLANT
GLOVE ORTHO TXT STRL SZ7.5 (GLOVE) ×2 IMPLANT
GOWN STRL REUS W/ TWL LRG LVL3 (GOWN DISPOSABLE) ×2 IMPLANT
HOLDER FOLEY CATH W/STRAP (MISCELLANEOUS) IMPLANT
INSERT MED ATTUNE KNEE RT 6 10 (Insert) IMPLANT
KIT TURNOVER KIT A (KITS) ×1 IMPLANT
MANIFOLD NEPTUNE II (INSTRUMENTS) ×1 IMPLANT
NDL SAFETY ECLIP 18X1.5 (MISCELLANEOUS) IMPLANT
NS IRRIG 1000ML POUR BTL (IV SOLUTION) ×1 IMPLANT
PACK TOTAL KNEE CUSTOM (KITS) ×1 IMPLANT
PAD COLD SHLDR WRAP-ON (PAD) IMPLANT
PENCIL SMOKE EVACUATOR (MISCELLANEOUS) ×1 IMPLANT
PIN FIX SIGMA LCS THRD HI (PIN) IMPLANT
PROTECTOR NERVE ULNAR (MISCELLANEOUS) ×1 IMPLANT
SET HNDPC FAN SPRY TIP SCT (DISPOSABLE) ×1 IMPLANT
SET PAD KNEE POSITIONER (MISCELLANEOUS) ×1 IMPLANT
SPIKE FLUID TRANSFER (MISCELLANEOUS) ×2 IMPLANT
SUT MNCRL AB 4-0 PS2 18 (SUTURE) ×1 IMPLANT
SUT STRATAFIX PDS+ 0 24IN (SUTURE) ×1 IMPLANT
SUT VIC AB 1 CT1 36 (SUTURE) ×1 IMPLANT
SUT VIC AB 2-0 CT1 TAPERPNT 27 (SUTURE) ×2 IMPLANT
SYR 3ML LL SCALE MARK (SYRINGE) IMPLANT
TIP HIGH FLOW IRRIGATION COAX (MISCELLANEOUS) IMPLANT
TOWEL GREEN STERILE FF (TOWEL DISPOSABLE) ×1 IMPLANT
TRAY FOLEY MTR SLVR 16FR STAT (SET/KITS/TRAYS/PACK) ×1 IMPLANT
TUBE SUCTION HIGH CAP CLEAR NV (SUCTIONS) ×1 IMPLANT
WATER STERILE IRR 1000ML POUR (IV SOLUTION) ×2 IMPLANT
WRAP KNEE MAXI GEL POST OP (GAUZE/BANDAGES/DRESSINGS) ×1 IMPLANT

## 2024-07-01 NOTE — Transfer of Care (Signed)
 Immediate Anesthesia Transfer of Care Note  Patient: Mike Hodge.  Procedure(s) Performed: ARTHROPLASTY, KNEE, TOTAL (Right: Knee)  Patient Location: PACU  Anesthesia Type:MAC, Regional, and Spinal  Level of Consciousness: awake, drowsy, patient cooperative, and responds to stimulation  Airway & Oxygen Therapy: Patient Spontanous Breathing and Patient connected to face mask oxygen  Post-op Assessment: Report given to RN and Post -op Vital signs reviewed and stable  Post vital signs: Reviewed and stable  Last Vitals:  Vitals Value Taken Time  BP 134/87 07/01/24 13:50  Temp 36.7 C 07/01/24 13:50  Pulse 91 07/01/24 13:51  Resp 38 07/01/24 13:51  SpO2 94 % 07/01/24 13:51  Vitals shown include unfiled device data.  Last Pain:  Vitals:   07/01/24 1125  TempSrc:   PainSc: 0-No pain         Complications: No notable events documented.

## 2024-07-01 NOTE — Op Note (Signed)
 NAME:  Mike Hodge.                      MEDICAL RECORD NO.:  [id number]                             FACILITY:  [facility]      PHYSICIAN:  Donnice BIRCH. Ernie, M.D.  DATE OF BIRTH:  Aug 06, 1965      DATE OF PROCEDURE:  07/01/2024                                     OPERATIVE REPORT         PREOPERATIVE DIAGNOSIS:  Right knee osteoarthritis.      POSTOPERATIVE DIAGNOSIS:  Right knee osteoarthritis.      FINDINGS:  The patient was noted to have complete loss of cartilage and   bone-on-bone arthritis with associated osteophytes in the medial and patellofemoral compartments of   the knee with a significant synovitis and associated effusion.  The patient had failed months of conservative treatment including medications, injection therapy, activity modification.     PROCEDURE:  Right total knee replacement.      COMPONENTS USED:  DePuy Attune FB CR MS knee   system, a size 6 femur, 7 tibia, size 10 mm CR MS AOX insert, and 38 anatomic patellar   button.      SURGEON:  Donnice BIRCH. Ernie, M.D.      ASSISTANT:  Rosina Calin, PA-C.      ANESTHESIA:  Regional and Spinal.      SPECIMENS:  None.      COMPLICATION:  None.      DRAINS:  None.  EBL: <300cc      TOURNIQUET TIME:  No tourniquet was used     The patient was stable to the recovery room.      INDICATION FOR PROCEDURE:  Mike Hodge. is a 58 y.o. male patient of   mine.  The patient had been seen, evaluated, and treated for months conservatively in the   office with medication, activity modification, and injections.  The patient had   radiographic changes of bone-on-bone arthritis with endplate sclerosis and osteophytes noted.  Based on the radiographic changes and failed conservative measures, the patient   decided to proceed with definitive treatment, total knee replacement.  Risks of infection, DVT, component failure, need for revision surgery, neurovascular injury were reviewed in the office setting.  The  postop course was reviewed stressing the efforts to maximize post-operative satisfaction and function.  Consent was obtained for benefit of pain   relief.      PROCEDURE IN DETAIL:  The patient was brought to the operative theater.   Once adequate anesthesia, preoperative antibiotics, 3 gm of Ancef ,1 gm of Tranexamic Acid , and 10 mg of Decadron  administered, the patient was positioned supine with bony prominences padded and protected.  The  right lower extremity was prepped and draped in sterile fashion.  A time-   out was performed identifying the patient, planned procedure, and the appropriate extremity.      The right lower extremity was placed in the [facility] leg holder.  A midline incision was   made followed by median parapatellar arthrotomy.  Following initial   exposure, attention was first directed to the patella.  Precut   measurement was noted  to be 25 mm.  I resected down to 14 mm and used a   38 anatomic patellar button to restore patellar height as well as cover the cut surface.      The lug holes were drilled and a metal shim was placed to protect the   patella from retractors and saw blade during the procedure.      At this point, attention was now directed to the femur.  The femoral   canal was opened with a drill, irrigated to try to prevent fat emboli.  An   intramedullary rod was passed at 5 degrees valgus, 10 mm of bone was   resected off the distal femur.  Following this resection, the tibia was   subluxated anteriorly.  Using the extramedullary guide, 2 mm of bone was resected off   the proximal medial tibia.  We confirmed the gap would be   stable medially and laterally with a size 8 spacer block as well as confirmed that the tibial cut was perpendicular in the coronal plane, checking with an alignment rod.      Once this was done, I sized the femur to be a size 6 in the anterior-   posterior dimension, chose a standard component based on medial and   lateral  dimension.  The size 6 rotation block was then pinned in   position anterior referenced using the C-clamp to set rotation.  The   anterior, posterior, and  chamfer cuts were made without difficulty nor   notching making certain that I was along the anterior cortex to help   with flexion gap stability.      The final box cut was made off the lateral aspect of distal femur.      At this point, the tibia was sized to be a size 7.  The size 7 tray was   then pinned in position through the medial third of the tubercle,   drilled, and keel punched.  Trial reduction was now carried with a 6 femur,  7 tibia, a size 10 mm CR insert, and the 38 anatomic patella botton.  The knee was brought to full extension with good flexion stability with the patella   tracking through the trochlea without application of pressure.  Given   all these findings the trial components removed.  Final components were   opened and cement was mixed.  The knee was irrigated with normal saline solution and pulse lavage.  The synovial lining was   then injected with 30 cc of 0.25% Marcaine  with epinephrine , 1 cc of Toradol  and 30 cc of NS for a total of 61 cc.     Final implants were then cemented onto cleaned and dried cut surfaces of bone with the knee brought to extension with a size 10 mm CR trial insert.      Once the cement had fully cured, excess cement was removed   throughout the knee.  I confirmed that I was satisfied with the range of   motion and stability, and the final size 10 mm CR AOX insert was chosen.  It was   placed into the knee.     No significant hemostasis was required.  The extensor mechanism was then reapproximated using #1 Vicryl and #1 Stratafix sutures with the knee   in flexion.  The   remaining wound was closed with 2-0 Vicryl and running 4-0 Monocryl.   The knee was cleaned, dried, dressed sterilely using Dermabond and  Aquacel dressing.  The patient was then   brought to recovery room in  stable condition, tolerating the procedure   well.   Please note that Physician Assistant, Rosina Calin, PA-C was present for the entirety of the case, and was utilized for pre-operative positioning, peri-operative retractor management, general facilitation of the procedure and for primary wound closure at the end of the case.              Donnice CORDOBA Ernie, M.D.    07/01/2024 10:54 AM

## 2024-07-01 NOTE — Evaluation (Signed)
 Physical Therapy Evaluation Patient Details Name: Mike Hodge. MRN: [id number] DOB: 1965/12/14 Today's Date: 07/01/2024  History of Present Illness  Pt is 58 yo male s/p R TKA on 07/01/24.  Pt with hx including but not limited to arthritis, pre-diabetic, anterior cervical corpectomy 2022  Clinical Impression  Pt is s/p TKA resulting in the deficits listed below (see PT Problem List). At baseline, pt independent.  He lives single level home with only 1 step to enter.  Pt has support at home and DME.  Today, pt with good pain control, fair quad activation, and sensation intact.  He was able to transfer with min A and ambulated 15' with RW and CGA with decreased weight shift to R.  Pt expected to progress well.  Pt will benefit from acute skilled PT to increase their independence and safety with mobility to allow discharge.          If plan is discharge home, recommend the following: A little help with walking and/or transfers;A little help with bathing/dressing/bathroom;Assistance with cooking/housework;Help with stairs or ramp for entrance   Can travel by private vehicle        Equipment Recommendations None recommended by PT  Recommendations for Other Services       Functional Status Assessment Patient has had a recent decline in their functional status and demonstrates the ability to make significant improvements in function in a reasonable and predictable amount of time.     Precautions / Restrictions Precautions Precautions: Fall;Knee Restrictions Weight Bearing Restrictions Per Provider Order: Yes RLE Weight Bearing Per Provider Order: Weight bearing as tolerated      Mobility  Bed Mobility Overal bed mobility: Needs Assistance Bed Mobility: Supine to Sit, Sit to Supine     Supine to sit: Min assist, Used rails, HOB elevated Sit to supine: Min assist, Used rails, HOB elevated        Transfers Overall transfer level: Needs assistance Equipment used:  Rolling walker (2 wheels) Transfers: Sit to/from Stand Sit to Stand: Min assist           General transfer comment: STS x 2; min A to steady, cues for hand placement and R LE management    Ambulation/Gait Ambulation/Gait assistance: Contact guard assist Gait Distance (Feet): 15 Feet Assistive device: Rolling walker (2 wheels) Gait Pattern/deviations: Step-to pattern, Decreased stride length, Decreased weight shift to right Gait velocity: decreased     General Gait Details: min cues for sequencing and RW use  Stairs            Wheelchair Mobility     Tilt Bed    Modified Rankin (Stroke Patients Only)       Balance Overall balance assessment: Needs assistance Sitting-balance support: No upper extremity supported Sitting balance-Leahy Scale: Good     Standing balance support: Bilateral upper extremity supported, Reliant on assistive device for balance Standing balance-Leahy Scale: Poor                               Pertinent Vitals/Pain Pain Assessment Pain Assessment: 0-10 Pain Score: 1  Pain Location: R knee Pain Descriptors / Indicators: Sore Pain Intervention(s): Limited activity within patient's tolerance, Monitored during session, Premedicated before session, Repositioned, Ice applied    Home Living Family/patient expects to be discharged to:: Private residence Living Arrangements: Spouse/significant other Available Help at Discharge: Family;Available 24 hours/day Type of Home: House Home Access: Stairs to enter Entrance Stairs-Rails: Right;Left  Entrance Stairs-Number of Steps: 1   Home Layout: One level Home Equipment: Agricultural Consultant (2 wheels);Cane - single point;Shower seat      Prior Function Prior Level of Function : Independent/Modified Independent;Driving             Mobility Comments: Could ambulate in community typically without AD - did take cane recently if doing a lot of walking ADLs Comments: independent adls  and iadls     Extremity/Trunk Assessment   Upper Extremity Assessment Upper Extremity Assessment: Overall WFL for tasks assessed    Lower Extremity Assessment Lower Extremity Assessment: LLE deficits/detail;RLE deficits/detail RLE Deficits / Details: Expected post op changes; ROM 5 to 60 degrees; MMT: ankle 5/5, knee and hip 1/5 LLE Deficits / Details: ROM WFL; MMT 5/5    Cervical / Trunk Assessment Cervical / Trunk Assessment: Normal  Communication        Cognition Arousal: Alert Behavior During Therapy: WFL for tasks assessed/performed   PT - Cognitive impairments: No apparent impairments                                 Cueing       General Comments General comments (skin integrity, edema, etc.): VSS on RA; educated to rest with leg straight ; encouraged ankle pumps hourly    Exercises Total Joint Exercises Ankle Circles/Pumps: AROM, Both, 5 reps, Supine   Assessment/Plan    PT Assessment Patient needs continued PT services  PT Problem List Decreased strength;Decreased range of motion;Decreased activity tolerance;Decreased balance;Decreased mobility;Decreased knowledge of precautions;Decreased knowledge of use of DME;Pain       PT Treatment Interventions DME instruction;Therapeutic exercise;Gait training;Stair training;Functional mobility training;Therapeutic activities;Patient/family education;Modalities;Balance training    PT Goals (Current goals can be found in the Care Plan section)  Acute Rehab PT Goals Patient Stated Goal: return home PT Goal Formulation: With patient/family Time For Goal Achievement: 07/15/24 Potential to Achieve Goals: Good    Frequency 7X/week     Co-evaluation               AM-PAC PT 6 Clicks Mobility  Outcome Measure Help needed turning from your back to your side while in a flat bed without using bedrails?: A Little Help needed moving from lying on your back to sitting on the side of a flat bed without  using bedrails?: A Little Help needed moving to and from a bed to a chair (including a wheelchair)?: A Little Help needed standing up from a chair using your arms (e.g., wheelchair or bedside chair)?: A Little Help needed to walk in hospital room?: A Lot Help needed climbing 3-5 steps with a railing? : A Lot 6 Click Score: 16    End of Session Equipment Utilized During Treatment: Gait belt Activity Tolerance: Patient tolerated treatment well Patient left: in bed;with call bell/phone within reach;with bed alarm set;with SCD's reapplied Nurse Communication: Mobility status PT Visit Diagnosis: Other abnormalities of gait and mobility (R26.89);Muscle weakness (generalized) (M62.81)    Time: 8348-8281 PT Time Calculation (min) (ACUTE ONLY): 27 min   Charges:   PT Evaluation $PT Eval Low Complexity: 1 Low PT Treatments $Gait Training: 8-22 mins PT General Charges $$ ACUTE PT VISIT: 1 Visit         Benjiman, PT Acute Rehab [facility] [phone]   Benjiman VEAR Mulberry 07/01/2024, 5:48 PM

## 2024-07-01 NOTE — Interval H&P Note (Signed)
 History and Physical Interval Note:  07/01/2024 10:54 AM  Mike Hodge.  has presented today for surgery, with the diagnosis of Right knee osteoarthritis.  The various methods of treatment have been discussed with the patient and family. After consideration of risks, benefits and other options for treatment, the patient has consented to  Procedures: ARTHROPLASTY, KNEE, TOTAL (Right) as a surgical intervention.  The patient's history has been reviewed, patient examined, no change in status, stable for surgery.  I have reviewed the patient's chart and labs.  Questions were answered to the patient's satisfaction.     [name]

## 2024-07-01 NOTE — Anesthesia Procedure Notes (Signed)
 Procedure Name: MAC Date/Time: 07/01/2024 12:15 PM  Performed by: Nada Corean CROME, CRNAPre-anesthesia Checklist: Patient identified, Emergency Drugs available, Suction available, Patient being monitored and Timeout performed Patient Re-evaluated:Patient Re-evaluated prior to induction Oxygen Delivery Method: Simple face mask Preoxygenation: Pre-oxygenation with 100% oxygen Induction Type: IV induction Ventilation: Nasal airway inserted- appropriate to patient size Placement Confirmation: positive ETCO2 Dental Injury: Teeth and Oropharynx as per pre-operative assessment

## 2024-07-01 NOTE — Care Plan (Signed)
 Ortho Bundle Case Management Note  Patient Details  Name: Theotis Gerdeman. MRN: [id number] Date of Birth: 03/18/1966  R TKA on 07/01/24  DCP: Home with wife  DME: No needs  PT: OLIVA Dadds                   DME Arranged:  N/A DME Agency:  NA  HH Arranged:    HH Agency:     Additional Comments: Please contact me with any questions of if this plan should need to change.  Lyle Pepper, CCM [facility] [phone]  Ext. [phone]   07/01/2024, 11:56 AM

## 2024-07-01 NOTE — Anesthesia Procedure Notes (Signed)
 Anesthesia Regional Block: Adductor canal block   Pre-Anesthetic Checklist: , timeout performed,  Correct Patient, Correct Site, Correct Laterality,  Correct Procedure, Correct Position, site marked,  Risks and benefits discussed,  Surgical consent,  Pre-op evaluation,  At surgeon's request and post-op pain management  Laterality: Right  Prep: Maximum Sterile Barrier Precautions used, chloraprep       Needles:  Injection technique: Single-shot  Needle Type: Echogenic Stimulator Needle     Needle Length: 9cm  Needle Gauge: 22     Additional Needles:   Procedures:,,,, ultrasound used (permanent image in chart),,    Narrative:  Start time: 07/01/2024 11:15 AM End time: 07/01/2024 11:20 AM Injection made incrementally with aspirations every 5 mL.  Performed by: Personally  Anesthesiologist: Merla Almarie HERO, DO  Additional Notes: Monitors applied. No increased pain on injection. No increased resistance to injection. Injection made in 5cc increments. Good needle visualization. Patient tolerated procedure well.

## 2024-07-01 NOTE — Anesthesia Postprocedure Evaluation (Signed)
 Anesthesia Post Note  Patient: Mike Hodge.  Procedure(s) Performed: ARTHROPLASTY, KNEE, TOTAL (Right: Knee)     Patient location during evaluation: PACU Anesthesia Type: Regional, Spinal and MAC Level of consciousness: awake and alert and oriented Pain management: pain level controlled Vital Signs Assessment: post-procedure vital signs reviewed and stable Respiratory status: spontaneous breathing, nonlabored ventilation and respiratory function stable Cardiovascular status: blood pressure returned to baseline and stable Postop Assessment: no headache, no backache, spinal receding and no apparent nausea or vomiting Anesthetic complications: no   No notable events documented.  Last Vitals:  Vitals:   07/01/24 1400 07/01/24 1415  BP: 132/86 133/89  Pulse: 84 69  Resp: (!) 22 18  Temp:    SpO2: 97% 92%    Last Pain:  Vitals:   07/01/24 1415  TempSrc:   PainSc: 0-No pain                 Almarie CHRISTELLA Marchi

## 2024-07-01 NOTE — Anesthesia Procedure Notes (Signed)
 Spinal  Patient location during procedure: OR Start time: 07/01/2024 12:13 PM End time: 07/01/2024 12:16 PM Reason for block: surgical anesthesia  Staffing Performed: anesthesiologist  Authorized by: Merla Almarie HERO, DO   Performed by: Merla Almarie HERO, DO  Preanesthetic Checklist Completed: patient identified, IV checked, risks and benefits discussed, surgical consent, monitors and equipment checked, pre-op evaluation and timeout performed Spinal Block Patient position: sitting Prep: DuraPrep and site prepped and draped Patient monitoring: cardiac monitor, continuous pulse ox and blood pressure Approach: midline Location: L3-4 Injection technique: single-shot Needle Needle type: Pencan  Needle gauge: 24 G Needle length: 9 cm Assessment Sensory level: T6 Events: CSF return  Additional Notes Functioning IV was confirmed and monitors were applied. Sterile prep and drape, including hand hygiene and sterile gloves were used. The patient was positioned and the spine was prepped. The skin was anesthetized with lidocaine .  Free flow of clear CSF was obtained prior to injecting local anesthetic into the CSF.  The spinal needle aspirated freely following injection.  The needle was carefully withdrawn.  The patient tolerated the procedure well.

## 2024-07-01 NOTE — Discharge Instructions (Signed)

## 2024-07-02 ENCOUNTER — Other Ambulatory Visit (HOSPITAL_COMMUNITY): Payer: Self-pay

## 2024-07-02 DIAGNOSIS — M1711 Unilateral primary osteoarthritis, right knee: Secondary | ICD-10-CM | POA: Diagnosis not present

## 2024-07-02 LAB — BASIC METABOLIC PANEL WITH GFR
Anion gap: 10 (ref 5–15)
BUN: 10 mg/dL (ref 6–20)
CO2: 21 mmol/L — ABNORMAL LOW (ref 22–32)
Calcium: 8.5 mg/dL — ABNORMAL LOW (ref 8.9–10.3)
Chloride: 105 mmol/L (ref 98–111)
Creatinine, Ser: 0.77 mg/dL (ref 0.61–1.24)
GFR, Estimated: >60 mL/min (ref >60)
Glucose, Bld: 168 mg/dL — ABNORMAL HIGH (ref 70–99)
Potassium: 4.5 mmol/L (ref 3.5–5.1)
Sodium: 136 mmol/L (ref 135–145)

## 2024-07-02 LAB — CBC
HCT: 41.5 % (ref 39.0–52.0)
Hemoglobin: 13.9 g/dL (ref 13.0–17.0)
MCH: 29.6 pg (ref 26.0–34.0)
MCHC: 33.5 g/dL (ref 30.0–36.0)
MCV: 88.5 fL (ref 80.0–100.0)
Platelets: 291 K/uL (ref 150–400)
RBC: 4.69 MIL/uL (ref 4.22–5.81)
RDW: 12.2 % (ref 11.5–15.5)
WBC: 15 K/uL — ABNORMAL HIGH (ref 4.0–10.5)
nRBC: 0 % (ref 0.0–0.2)

## 2024-07-02 MED ORDER — CELECOXIB 200 MG PO CAPS
200.0000 mg | ORAL_CAPSULE | Freq: Two times a day (BID) | ORAL | 0 refills | Status: DC
  Filled 2024-07-02: qty 60, 30d supply, fill #0

## 2024-07-02 MED ORDER — POLYETHYLENE GLYCOL 3350 17 GM/SCOOP PO POWD
17.0000 g | Freq: Two times a day (BID) | ORAL | 0 refills | Status: DC
  Filled 2024-07-02: qty 238, 7d supply, fill #0

## 2024-07-02 MED ORDER — SENNA 8.6 MG PO TABS
2.0000 | ORAL_TABLET | Freq: Every day | ORAL | 0 refills | Status: AC
  Filled 2024-07-02: qty 28, 14d supply, fill #0

## 2024-07-02 MED ORDER — CEFADROXIL 500 MG PO CAPS
500.0000 mg | ORAL_CAPSULE | Freq: Two times a day (BID) | ORAL | 0 refills | Status: AC
  Filled 2024-07-02: qty 14, 7d supply, fill #0

## 2024-07-02 MED ORDER — OXYCODONE HCL 5 MG PO TABS
5.0000 mg | ORAL_TABLET | ORAL | 0 refills | Status: DC | PRN
  Filled 2024-07-02 (×2): qty 42, 7d supply, fill #0

## 2024-07-02 MED ORDER — METHOCARBAMOL 500 MG PO TABS
500.0000 mg | ORAL_TABLET | Freq: Four times a day (QID) | ORAL | 0 refills | Status: DC | PRN
  Filled 2024-07-02: qty 40, 10d supply, fill #0

## 2024-07-02 MED ORDER — ACETAMINOPHEN 500 MG PO TABS: 1000.0000 mg | ORAL_TABLET | Freq: Four times a day (QID) | ORAL | Status: DC

## 2024-07-02 MED FILL — Aspirin Chew Tab 81 MG: 81.0000 mg | ORAL | 28 days supply | Qty: 56 | Fill #0 | Status: AC

## 2024-07-02 NOTE — Progress Notes (Signed)
 Physical Therapy Treatment Patient Details Name: Mike Hodge. MRN: [id number] DOB: June 16, 1966 Today's Date: 07/02/2024   History of Present Illness Pt is 58 yo male s/p R TKA on 07/01/24.  Pt with hx including but not limited to arthritis, pre-diabetic, anterior cervical corpectomy 2022    PT Comments  Pt is POD #  and is progressing well.  Pt ambulating 80' and performed steps similar to home set up.  Good pain control, quad activation, and ROM.  Pt demonstrates safe gait & transfers in order to return home from PT perspective once discharged by MD.  While in hospital, will continue to benefit from PT for skilled therapy to advance mobility and exercises.       If plan is discharge home, recommend the following: A little help with walking and/or transfers;A little help with bathing/dressing/bathroom;Assistance with cooking/housework;Help with stairs or ramp for entrance   Can travel by private vehicle        Equipment Recommendations  None recommended by PT    Recommendations for Other Services       Precautions / Restrictions Precautions Precautions: Fall;Knee Restrictions RLE Weight Bearing Per Provider Order: Weight bearing as tolerated     Mobility  Bed Mobility Overal bed mobility: Needs Assistance Bed Mobility: Supine to Sit, Sit to Supine     Supine to sit: Supervision Sit to supine: Supervision        Transfers Overall transfer level: Needs assistance Equipment used: Rolling walker (2 wheels) Transfers: Sit to/from Stand Sit to Stand: Supervision           General transfer comment: STSx2, good handplacement, min cues for R LE    Ambulation/Gait Ambulation/Gait assistance: Supervision, Contact guard assist Gait Distance (Feet): 80 Feet Assistive device: Rolling walker (2 wheels) Gait Pattern/deviations: Step-to pattern, Decreased stride length, Decreased weight shift to right Gait velocity: decreased but functional     General Gait  Details: Progressed to supervision; antalgic but stable; cues for sequencing with good i73mprovement   Stairs Stairs: Yes Stairs assistance: Contact guard assist   Number of Stairs: 2 General stair comments: performed 1 six inch platform forward x 1 and backward x 1 with RW; tolerated well; educated on sequencing   Wheelchair Mobility     Tilt Bed    Modified Rankin (Stroke Patients Only)       Balance Overall balance assessment: Needs assistance Sitting-balance support: No upper extremity supported Sitting balance-Leahy Scale: Good     Standing balance support: Bilateral upper extremity supported, Reliant on assistive device for balance Standing balance-Leahy Scale: Fair Standing balance comment: RW to ambulate; could static stand without support                            Communication    Cognition Arousal: Alert Behavior During Therapy: WFL for tasks assessed/performed   PT - Cognitive impairments: No apparent impairments                                Cueing    Exercises Total Joint Exercises Ankle Circles/Pumps: AROM, Both, 10 reps, Supine Quad Sets: AROM, Both, 10 reps, Supine Heel Slides: AAROM, Right, 10 reps, Supine Hip ABduction/ADduction: AAROM, Right, 10 reps, Supine Long Arc Quad: AROM, Right, 10 reps, Seated Knee Flexion: AROM, Right, 10 reps, Seated Goniometric ROM: R knee 3 to 80    General Comments   Educated  on safe ice use, no pivots, car transfers, resting with leg straight, and TED hose during day. Also, encouraged walking every 1-2 hours during day. Educated on HEP with focus on mobility the first weeks. Discussed doing exercises within pain control and if pain increasing could decreased ROM, reps, and stop exercises as needed. Encouraged to perform quad sets and ankle pumps frequently for blood flow and to promote full knee extension.      Pertinent Vitals/Pain Pain Assessment Pain Assessment: 0-10 Pain Score: 2   Pain Location: R knee Pain Descriptors / Indicators: Sore Pain Intervention(s): Limited activity within patient's tolerance, Monitored during session, Premedicated before session, Repositioned, Ice applied    Home Living                          Prior Function            PT Goals (current goals can now be found in the care plan section) Progress towards PT goals: Progressing toward goals    Frequency    7X/week      PT Plan      Co-evaluation              AM-PAC PT 6 Clicks Mobility   Outcome Measure  Help needed turning from your back to your side while in a flat bed without using bedrails?: A Little Help needed moving from lying on your back to sitting on the side of a flat bed without using bedrails?: A Little Help needed moving to and from a bed to a chair (including a wheelchair)?: A Little Help needed standing up from a chair using your arms (e.g., wheelchair or bedside chair)?: A Little Help needed to walk in hospital room?: A Little Help needed climbing 3-5 steps with a railing? : A Little 6 Click Score: 18    End of Session Equipment Utilized During Treatment: Gait belt Activity Tolerance: Patient tolerated treatment well Patient left: with call bell/phone within reach;in chair;with chair alarm set Nurse Communication: Mobility status PT Visit Diagnosis: Other abnormalities of gait and mobility (R26.89);Muscle weakness (generalized) (M62.81)     Time: 8984-8956 PT Time Calculation (min) (ACUTE ONLY): 28 min  Charges:    $Gait Training: 8-22 mins $Therapeutic Exercise: 8-22 mins PT General Charges $$ ACUTE PT VISIT: 1 Visit                     Benjiman, PT Acute Rehab [facility] [phone]    Benjiman VEAR Mulberry 07/02/2024, 11:31 AM

## 2024-07-02 NOTE — Progress Notes (Signed)
 Patient verbalized understanding of all dc instructions, has DME at home, [facility] pharmacy contacted to delivery meds to bed, PT about to work with patient now, wife is on the way

## 2024-07-02 NOTE — Progress Notes (Signed)
° °  Subjective: 1 Day Post-Op Procedures (LRB): ARTHROPLASTY, KNEE, TOTAL (Right) Patient reports pain as mild.   Patient seen in rounds for Dr. Ernie. Patient is well, and has had no acute complaints or problems. He is sitting up having breakfastNo acute events overnight. Foley catheter removed. Patient ambulated 15 feet with PT.  We will start therapy today.   Objective: Vital signs in last 24 hours: Temp:  [97.5 F (36.4 C)-98.2 F (36.8 C)] 97.9 F (36.6 C) (12/17 0635) Pulse Rate:  [54-88] 65 (12/17 0635) Resp:  [13-24] 18 (12/17 0635) BP: (126-143)/(73-93) 133/73 (12/17 0635) SpO2:  [92 %-98 %] 97 % (12/17 0635) Weight:  [126.1 kg] 126.1 kg (12/16 0936)  Intake/Output from previous day:  Intake/Output Summary (Last 24 hours) at 07/02/2024 0821 Last data filed at 07/02/2024 0700 Gross per 24 hour  Intake 2057.4 ml  Output 2625 ml  Net -567.6 ml     Intake/Output this shift: No intake/output data recorded.  Labs: Recent Labs    07/01/24 1117 07/02/24 0342  HGB 15.4 13.9   Recent Labs    07/01/24 1117 07/02/24 0342  WBC 8.0 15.0*  RBC 5.23 4.69  HCT 46.9 41.5  PLT 285 291   Recent Labs    07/02/24 0342  NA 136  K 4.5  CL 105  CO2 21*  BUN 10  CREATININE 0.77  GLUCOSE 168*  CALCIUM 8.5*   No results for input(s): LABPT, INR in the last 72 hours.  Exam: General - Patient is Alert and Oriented Extremity - Neurologically intact Sensation intact distally Intact pulses distally Dorsiflexion/Plantar flexion intact Dressing - dressing C/D/I Motor Function - intact, moving foot and toes well on exam.   Past Medical History:  Diagnosis Date   Arthritis    Medical history non-contributory    Pre-diabetes     Assessment/Plan: 1 Day Post-Op Procedures (LRB): ARTHROPLASTY, KNEE, TOTAL (Right) Principal Problem:   S/P total knee arthroplasty, right  Estimated body mass index is 37.7 kg/m as calculated from the following:   Height as of  this encounter: 6' (1.829 m).   Weight as of this encounter: 126.1 kg. Advance diet Up with therapy D/C IV fluids   Patient's anticipated LOS is less than 2 midnights, meeting these requirements: - Younger than 8 - Lives within 1 hour of care - Has a competent adult at home to recover with post-op recover - NO history of  - Chronic pain requiring opiods  - Diabetes  - Coronary Artery Disease  - Heart failure  - Heart attack  - Stroke  - DVT/VTE  - Cardiac arrhythmia  - Respiratory Failure/COPD  - Renal failure  - Anemia  - Advanced Liver disease     DVT Prophylaxis - Aspirin  Weight bearing as tolerated.  Hgb stable at 13.9 this AM  Will send home on 1 week po abx due to BMI   Plan is to go Home after hospital stay. Plan for discharge today following 1-2 sessions of PT as long as they are meeting their goals. Patient is scheduled for OPPT. Follow up in the office in 2 weeks.   Rosina Calin, PA-C Orthopedic Surgery [phone] 07/02/2024, 8:21 AM

## 2024-07-02 NOTE — Progress Notes (Signed)
 Discharge medications delivered to patient at the bedside.

## 2024-07-02 NOTE — TOC Transition Note (Signed)
 Transition of Care [facility]) - Discharge Note   Patient Details  Name: Mike Hodge. MRN: [id number] Date of Birth: 1965-07-31  Transition of Care [facility] CM/SW Contact:  Alfonse JONELLE Rex, RN Phone Number: 07/02/2024, 9:56 AM   Clinical Narrative:   Met with patient at bedside to review dc therapy and home equipment needs, pt confirmed OPPT [facility], has RW. No INPT CM needs.     Final next level of care: OP Rehab Barriers to Discharge: No Barriers Identified   Patient Goals and CMS Choice Patient states their goals for this hospitalization and ongoing recovery are:: return home          Discharge Placement                       Discharge Plan and Services Additional resources added to the After Visit Summary for                  DME Arranged: N/A DME Agency: NA                  Social Drivers of Health (SDOH) Interventions SDOH Screenings   Food Insecurity: No Food Insecurity (07/01/2024)  Housing: Low Risk (07/01/2024)  Transportation Needs: No Transportation Needs (07/01/2024)  Utilities: Not At Risk (07/01/2024)  Social Connections: Unknown (11/28/2021)   Received from [facility]  Tobacco Use: High Risk (07/01/2024)     Readmission Risk Interventions     No data to display

## 2024-07-02 NOTE — Plan of Care (Signed)
   Problem: Education: Goal: Knowledge of General Education information will improve Description Including pain rating scale, medication(s)/side effects and non-pharmacologic comfort measures Outcome: Progressing

## 2024-07-03 ENCOUNTER — Encounter (HOSPITAL_COMMUNITY): Payer: Self-pay | Admitting: Orthopedic Surgery

## 2024-07-08 NOTE — Discharge Summary (Signed)
 Patient ID: Mike Hodge. MRN: [id number] DOB/AGE: 58-Feb-1967 58 y.o.  Admit date: 07/01/2024 Discharge date: 07/02/2024  Admission Diagnoses:  Right knee osteoarthritis  Discharge Diagnoses:  Principal Problem:   S/P total knee arthroplasty, right   Past Medical History:  Diagnosis Date   Arthritis    Medical history non-contributory    Pre-diabetes     Surgeries: Procedures: ARTHROPLASTY, KNEE, TOTAL on 07/01/2024   Consultants:   Discharged Condition: Improved  Hospital Course: Mike Hodge. is an 58 y.o. male who was admitted 07/01/2024 for operative treatment ofS/P total knee arthroplasty, right. Patient has severe unremitting pain that affects sleep, daily activities, and work/hobbies. After pre-op clearance the patient was taken to the operating room on 07/01/2024 and underwent  Procedures: ARTHROPLASTY, KNEE, TOTAL.    Patient was given perioperative antibiotics:  Anti-infectives (From admission, onward)    Start     Dose/Rate Route Frequency Ordered Stop   07/02/24 0000  cefadroxil  (DURICEF) 500 MG capsule        500 mg Oral 2 times daily 07/02/24 0824 07/09/24 2359   07/01/24 1800  ceFAZolin  (ANCEF ) IVPB 2g/100 mL premix        2 g 200 mL/hr over 30 Minutes Intravenous Every 6 hours 07/01/24 1541 07/02/24 0050   07/01/24 0945  ceFAZolin  (ANCEF ) IVPB 3g/150 mL premix        3 g 300 mL/hr over 30 Minutes Intravenous On call to O.R. 07/01/24 0931 07/01/24 1245        Patient was given sequential compression devices, early ambulation, and chemoprophylaxis to prevent DVT. Patient worked with PT and was meeting their goals regarding safe ambulation and transfers.  Patient benefited maximally from hospital stay and there were no complications.    Recent vital signs: No data found.   Recent laboratory studies: No results for input(s): WBC, HGB, HCT, PLT, NA, K, CL, CO2, BUN, CREATININE, GLUCOSE, INR, CALCIUM in  the last 72 hours.  Invalid input(s): PT, 2   Discharge Medications:   Allergies as of 07/02/2024   No Known Allergies      Medication List     STOP taking these medications    diclofenac 75 MG EC tablet Commonly known as: VOLTAREN   ibuprofen 200 MG tablet Commonly known as: ADVIL       TAKE these medications    acetaminophen  500 MG tablet Commonly known as: TYLENOL  Take 2 tablets (1,000 mg total) by mouth every 6 (six) hours.   Aspirin  Low Dose 81 MG chewable tablet Generic drug: aspirin  Chew 1 tablet (81 mg total) by mouth 2 (two) times daily for 28 days.   cefadroxil  500 MG capsule Commonly known as: DURICEF Take 1 capsule (500 mg total) by mouth 2 (two) times daily for 7 days.   celecoxib  200 MG capsule Commonly known as: CELEBREX  Take 1 capsule (200 mg total) by mouth 2 (two) times daily.   methocarbamol  500 MG tablet Commonly known as: ROBAXIN  Take 1 tablet (500 mg total) by mouth every 6 (six) hours as needed for muscle spasms.   oxyCODONE  5 MG immediate release tablet Commonly known as: Oxy IR/ROXICODONE  Take 1 tablet (5 mg total) by mouth every 4 (four) hours as needed for severe pain (pain score 7-10).   polyethylene glycol powder 17 GM/SCOOP powder Commonly known as: GLYCOLAX /MIRALAX  Dissolve 1 capful (17g) in 4-8 ounces of liquid and take by mouth twice daily.   senna 8.6 MG Tabs tablet Commonly known as: SENOKOT Take  2 tablets (17.2 mg total) by mouth at bedtime for 14 days.               Discharge Care Instructions  (From admission, onward)           Start     Ordered   07/02/24 0000  Change dressing       Comments: Maintain surgical dressing until follow up in the clinic. If the edges start to pull up, may reinforce with tape. If the dressing is no longer working, may remove and cover with gauze and tape, but must keep the area dry and clean.  Call with any questions or concerns.   07/02/24 0824             Diagnostic Studies: No results found.  Disposition: Discharge disposition: 01-Home or Self Care       Discharge Instructions     Call MD / Call 911   Complete by: As directed    If you experience chest pain or shortness of breath, CALL 911 and be transported to the hospital emergency room.  If you develope a fever above 101 F, pus (white drainage) or increased drainage or redness at the wound, or calf pain, call your surgeon's office.   Change dressing   Complete by: As directed    Maintain surgical dressing until follow up in the clinic. If the edges start to pull up, may reinforce with tape. If the dressing is no longer working, may remove and cover with gauze and tape, but must keep the area dry and clean.  Call with any questions or concerns.   Constipation Prevention   Complete by: As directed    Drink plenty of fluids.  Prune juice may be helpful.  You may use a stool softener, such as Colace (over the counter) 100 mg twice a day.  Use MiraLax  (over the counter) for constipation as needed.   Diet - low sodium heart healthy   Complete by: As directed    Increase activity slowly as tolerated   Complete by: As directed    Weight bearing as tolerated with assist device (walker, cane, etc) as directed, use it as long as suggested by your surgeon or therapist, typically at least 4-6 weeks.   Post-operative opioid taper instructions:   Complete by: As directed    POST-OPERATIVE OPIOID TAPER INSTRUCTIONS: It is important to wean off of your opioid medication as soon as possible. If you do not need pain medication after your surgery it is ok to stop day one. Opioids include: Codeine, Hydrocodone (Norco, Vicodin), Oxycodone (Percocet, oxycontin ) and hydromorphone  amongst others.  Long term and even short term use of opiods can cause: Increased pain response Dependence Constipation Depression Respiratory depression And more.  Withdrawal symptoms can include Flu like  symptoms Nausea, vomiting And more Techniques to manage these symptoms Hydrate well Eat regular healthy meals Stay active Use relaxation techniques(deep breathing, meditating, yoga) Do Not substitute Alcohol to help with tapering If you have been on opioids for less than two weeks and do not have pain than it is ok to stop all together.  Plan to wean off of opioids This plan should start within one week post op of your joint replacement. Maintain the same interval or time between taking each dose and first decrease the dose.  Cut the total daily intake of opioids by one tablet each day Next start to increase the time between doses. The last dose that should be eliminated is the evening  dose.      TED hose   Complete by: As directed    Use stockings (TED hose) for 2 weeks on both leg(s).  You may remove them at night for sleeping.        Follow-up Information     [facility] Go on 07/03/2024.   Why: You are scheduled for physical therapy Thursday 07/03/24 at 10:50am Contact information: [address] [place] [address] [phone]         Ernie Cough, MD. Go on 07/18/2024.   Specialty: Orthopedic Surgery Why: You are scheduled for a post op appointment on Friday 07/18/24 at 11:30am Contact information: [address] [place] [address] [phone]                  Signed: Rosina JONELLE Calin 07/08/2024, 7:35 AM

## 2024-08-04 NOTE — Patient Instructions (Signed)
 SURGICAL WAITING ROOM VISITATION Patients having surgery or a procedure may have no more than 2 support people in the waiting area - these visitors may rotate in the visitor waiting room.   Due to an increase in RSV and influenza rates and associated hospitalizations, children ages 78 and under may not visit patients in [facility] hospitals. If the patient needs to stay at the hospital during part of their recovery, the visitor guidelines for inpatient rooms apply.  PRE-OP VISITATION  Pre-op nurse will coordinate an appropriate time for 1 support person to accompany the patient in pre-op.  This support person may not rotate.  This visitor will be contacted when the time is appropriate for the visitor to come back in the pre-op area.  Please refer to the [facility] website for the visitor guidelines for Inpatients (after your surgery is over and you are in a regular room).  You are not required to quarantine at this time prior to your surgery. However, you must do this: Hand Hygiene often Do NOT share personal items Notify your provider if you are in close contact with someone who has COVID or you develop fever 100.4 or greater, new onset of sneezing, cough, sore throat, shortness of breath or body aches.  If you test positive for Covid or have been in contact with anyone that has tested positive in the last 10 days please notify you surgeon.    Your procedure is scheduled on: 08/12/24   Report to [facility] Main Entrance: [place] entrance where the [employer] is available.   Report to admitting at:7:10 AM  Call this number if you have any questions or problems the morning of surgery [phone]  FOLLOW ANY ADDITIONAL PRE OP INSTRUCTIONS YOU RECEIVED FROM YOUR SURGEON'S OFFICE!!!  Do not eat food after Midnight the night prior to your surgery/procedure.  After Midnight you may have the following liquids until: 6:40 AM DAY OF SURGERY  Clear Liquid Diet Water  Black  Coffee (sugar ok, NO MILK/CREAM OR CREAMERS)  Tea (sugar ok, NO MILK/CREAM OR CREAMERS) regular and decaf                             Plain Jell-O  with no fruit (NO RED)                                           Fruit ices (not with fruit pulp, NO RED)                                     Popsicles (NO RED)                                                                  Juice: NO CITRUS JUICES: only apple, WHITE grape, WHITE cranberry Sports drinks like Gatorade or Powerade (NO RED)   The day of surgery:  Drink ONE (1) Pre-Surgery Clear Ensure at : 6:40 AM the morning of surgery. Drink in one sitting. Do not sip.  This drink was given to  you during your hospital pre-op appointment visit. Nothing else to drink after completing the Pre-Surgery Clear Ensure or G2 : No candy, chewing gum or throat lozenges.    Oral Hygiene is also important to reduce your risk of infection.        Remember - BRUSH YOUR TEETH THE MORNING OF SURGERY WITH YOUR REGULAR TOOTHPASTE  Do NOT smoke after Midnight the night before surgery.  STOP TAKING all Vitamins, Herbs and supplements 1 week before your surgery.   Take ONLY these medicines the morning of surgery with A SIP OF WATER : NONE.Tylenol  as needed.  If You have been diagnosed with Sleep Apnea - Bring CPAP mask and tubing day of surgery. We will provide you with a CPAP machine on the day of your surgery.                   You may not have any metal on your body including hair pins, jewelry, and body piercing  Do not wear lotions, powders, perfumes / cologne, or deodorant  Men may shave face and neck.  Contacts, Hearing Aids, dentures or bridgework may not be worn into surgery. DENTURES WILL BE REMOVED PRIOR TO SURGERY PLEASE DO NOT APPLY Poly grip OR ADHESIVES!!!  You may bring a small overnight bag with you on the day of surgery, only pack items that are not valuable. [facility] IS NOT RESPONSIBLE   FOR VALUABLES THAT ARE LOST OR STOLEN.   Patients  discharged on the day of surgery will not be allowed to drive home.  Someone NEEDS to stay with you for the first 24 hours after anesthesia.  Do not bring your home medications to the hospital. The Pharmacy will dispense medications listed on your medication list to you during your admission in the Hospital.  Special Instructions: Bring a copy of your healthcare power of attorney and living will documents the day of surgery, if you wish to have them scanned into your [facility] Records- EPIC  Please read over the following fact sheets you were given: IF YOU HAVE QUESTIONS ABOUT YOUR PRE-OP INSTRUCTIONS, PLEASE CALL [phone]  PATIENT SIGNATURE_________________________________  NURSE SIGNATURE__________________________________  ________________________________________________________________________  Pre-operative 4 CHG Bath Instructions  DYNA-Hex 4 Chlorhexidine  Gluconate 4% Solution Antiseptic 4 fl. oz   You can play a key role in reducing the risk of infection after surgery. Your skin needs to be as free of germs as possible. You can reduce the number of germs on your skin by washing with CHG (chlorhexidine  gluconate) soap before surgery. CHG is an antiseptic soap that kills germs and continues to kill germs even after washing.   DO NOT use if you have an allergy to chlorhexidine /CHG or antibacterial soaps. If your skin becomes reddened or irritated, stop using the CHG and notify one of our RNs at   Please shower with the CHG soap starting 4 days before surgery using the following schedule:     Please keep in mind the following:  DO NOT shave, including legs and underarms, starting the day of your first shower.   You may shave your face at any point before/day of surgery.  Place clean sheets on your bed the day you start using CHG soap. Use a clean washcloth (not used since being washed) for each shower. DO NOT sleep with pets once you start using the CHG.  CHG Shower  Instructions:  If you choose to wash your hair and private area, wash first with your normal shampoo/soap.  After  you use shampoo/soap, rinse your hair and body thoroughly to remove shampoo/soap residue.  Turn the water  OFF and apply about 3 tablespoons (45 ml) of CHG soap to a CLEAN washcloth.  Apply CHG soap ONLY FROM YOUR NECK DOWN TO YOUR TOES (washing for 3-5 minutes)  DO NOT use CHG soap on face, private areas, open wounds, or sores.  Pay special attention to the area where your surgery is being performed.  If you are having back surgery, having someone wash your back for you may be helpful. Wait 2 minutes after CHG soap is applied, then you may rinse off the CHG soap.  Pat dry with a clean towel  Put on clean clothes/pajamas   If you choose to wear lotion, please use ONLY the CHG-compatible lotions on the back of this paper.     Additional instructions for the day of surgery: DO NOT APPLY any lotions, deodorants, cologne, or perfumes.   Put on clean/comfortable clothes.  Brush your teeth.  Ask your nurse before applying any prescription medications to the skin.   CHG Compatible Lotions   Aveeno Moisturizing lotion  Cetaphil Moisturizing Cream  Cetaphil Moisturizing Lotion  Clairol Herbal Essence Moisturizing Lotion, Dry Skin  Clairol Herbal Essence Moisturizing Lotion, Extra Dry Skin  Clairol Herbal Essence Moisturizing Lotion, Normal Skin  Curel Age Defying Therapeutic Moisturizing Lotion with Alpha Hydroxy  Curel Extreme Care Body Lotion  Curel Soothing Hands Moisturizing Hand Lotion  Curel Therapeutic Moisturizing Cream, Fragrance-Free  Curel Therapeutic Moisturizing Lotion, Fragrance-Free  Curel Therapeutic Moisturizing Lotion, Original Formula  Eucerin Daily Replenishing Lotion  Eucerin Dry Skin Therapy Plus Alpha Hydroxy Crme  Eucerin Dry Skin Therapy Plus Alpha Hydroxy Lotion  Eucerin Original Crme  Eucerin Original Lotion  Eucerin Plus Crme Eucerin Plus  Lotion  Eucerin TriLipid Replenishing Lotion  Keri Anti-Bacterial Hand Lotion  Keri Deep Conditioning Original Lotion Dry Skin Formula Softly Scented  Keri Deep Conditioning Original Lotion, Fragrance Free Sensitive Skin Formula  Keri Lotion Fast Absorbing Fragrance Free Sensitive Skin Formula  Keri Lotion Fast Absorbing Softly Scented Dry Skin Formula  Keri Original Lotion  Keri Skin Renewal Lotion Keri Silky Smooth Lotion  Keri Silky Smooth Sensitive Skin Lotion  Nivea Body Creamy Conditioning Oil  Nivea Body Extra Enriched Lotion  Nivea Body Original Lotion  Nivea Body Sheer Moisturizing Lotion Nivea Crme  Nivea Skin Firming Lotion  NutraDerm 30 Skin Lotion  NutraDerm Skin Lotion  NutraDerm Therapeutic Skin Cream  NutraDerm Therapeutic Skin Lotion  ProShield Protective Hand Cream  Provon moisturizing lotion  Incentive Spirometer  An incentive spirometer is a tool that can help keep your lungs clear and active. This tool measures how well you are filling your lungs with each breath. Taking long deep breaths may help reverse or decrease the chance of developing breathing (pulmonary) problems (especially infection) following: A long period of time when you are unable to move or be active. BEFORE THE PROCEDURE  If the spirometer includes an indicator to show your best effort, your nurse or respiratory therapist will set it to a desired goal. If possible, sit up straight or lean slightly forward. Try not to slouch. Hold the incentive spirometer in an upright position. INSTRUCTIONS FOR USE  Sit on the edge of your bed if possible, or sit up as far as you can in bed or on a chair. Hold the incentive spirometer in an upright position. Breathe out normally. Place the mouthpiece in your mouth and seal your lips tightly around it.  Breathe in slowly and as deeply as possible, raising the piston or the ball toward the top of the column. Hold your breath for 3-5 seconds or for as long as  possible. Allow the piston or ball to fall to the bottom of the column. Remove the mouthpiece from your mouth and breathe out normally. Rest for a few seconds and repeat Steps 1 through 7 at least 10 times every 1-2 hours when you are awake. Take your time and take a few normal breaths between deep breaths. The spirometer may include an indicator to show your best effort. Use the indicator as a goal to work toward during each repetition. After each set of 10 deep breaths, practice coughing to be sure your lungs are clear. If you have an incision (the cut made at the time of surgery), support your incision when coughing by placing a pillow or rolled up towels firmly against it. Once you are able to get out of bed, walk around indoors and cough well. You may stop using the incentive spirometer when instructed by your caregiver.  RISKS AND COMPLICATIONS Take your time so you do not get dizzy or light-headed. If you are in pain, you may need to take or ask for pain medication before doing incentive spirometry. It is harder to take a deep breath if you are having pain. AFTER USE Rest and breathe slowly and easily. It can be helpful to keep track of a log of your progress. Your caregiver can provide you with a simple table to help with this. If you are using the spirometer at home, follow these instructions: SEEK MEDICAL CARE IF:  You are having difficultly using the spirometer. You have trouble using the spirometer as often as instructed. Your pain medication is not giving enough relief while using the spirometer. You develop fever of 100.5 F (38.1 C) or higher. SEEK IMMEDIATE MEDICAL CARE IF:  You cough up bloody sputum that had not been present before. You develop fever of 102 F (38.9 C) or greater. You develop worsening pain at or near the incision site. MAKE SURE YOU:  Understand these instructions. Will watch your condition. Will get help right away if you are not doing well or get  worse. Document Released: 11/13/2006 Document Revised: 09/25/2011 Document Reviewed: 01/14/2007 [facility] Patient Information 2014 [place], [place].   ________________________________________________________________________

## 2024-08-05 ENCOUNTER — Encounter (HOSPITAL_COMMUNITY): Payer: Self-pay

## 2024-08-05 ENCOUNTER — Encounter (HOSPITAL_COMMUNITY)
Admission: RE | Admit: 2024-08-05 | Discharge: 2024-08-05 | Disposition: A | Discharge status: Home / Self Care | Source: Ambulatory Visit | Attending: Orthopedic Surgery | Admitting: Orthopedic Surgery

## 2024-08-05 ENCOUNTER — Other Ambulatory Visit: Payer: Self-pay

## 2024-08-05 VITALS — BP 153/88 | HR 70 | Temp 98.0°F | Ht 72.0 in | Wt 207.0 lb

## 2024-08-05 DIAGNOSIS — Z01818 Encounter for other preprocedural examination: Secondary | ICD-10-CM | POA: Insufficient documentation

## 2024-08-05 LAB — CBC
HCT: 48.2 % (ref 39.0–52.0)
Hemoglobin: 15.6 g/dL (ref 13.0–17.0)
MCH: 29.8 pg (ref 26.0–34.0)
MCHC: 32.4 g/dL (ref 30.0–36.0)
MCV: 92 fL (ref 80.0–100.0)
Platelets: 245 K/uL (ref 150–400)
RBC: 5.24 MIL/uL (ref 4.22–5.81)
RDW: 13.1 % (ref 11.5–15.5)
WBC: 5.8 K/uL (ref 4.0–10.5)
nRBC: 0 % (ref 0.0–0.2)

## 2024-08-05 LAB — BASIC METABOLIC PANEL WITH GFR
Anion gap: 10 (ref 5–15)
BUN: 9 mg/dL (ref 6–20)
CO2: 25 mmol/L (ref 22–32)
Calcium: 9.3 mg/dL (ref 8.9–10.3)
Chloride: 105 mmol/L (ref 98–111)
Creatinine, Ser: 0.84 mg/dL (ref 0.61–1.24)
GFR, Estimated: >60 mL/min
Glucose, Bld: 89 mg/dL (ref 70–99)
Potassium: 5 mmol/L (ref 3.5–5.1)
Sodium: 140 mmol/L (ref 135–145)

## 2024-08-05 LAB — SURGICAL PCR SCREEN
MRSA, PCR: NEGATIVE
Staphylococcus aureus: NEGATIVE

## 2024-08-05 NOTE — Progress Notes (Signed)
 For Anesthesia: PCP - [facility]  Cardiologist - N/A  Bowel Prep reminder:  Chest x-ray -  EKG - 06/10/24: media tab Stress Test -  ECHO -  Cardiac Cath -  Pacemaker/ICD device last checked: Pacemaker orders received: Device Rep notified:  Spinal Cord Stimulator:N/A  Sleep Study - N/A CPAP -   Fasting Blood Sugar - N/A Checks Blood Sugar _____ times a day Date and result of last Hgb A1c-  Last dose of GLP1 agonist-  GLP1 instructions: Hold 7 days prior to schedule (Hold 24 hours-daily)   Last dose of SGLT-2 inhibitors- N/A SGLT-2 instructions: Hold 72 hours prior to surgery  Blood Thinner Instructions:N/A Last Dose: Time last taken:  Aspirin  Instructions: Last Dose: Time last taken:  Activity level: Can go up a flight of stairs and activities of daily living without stopping and without chest pain and/or shortness of breath   Able to exercise without chest pain and/or shortness of breath  Anesthesia review:   Patient denies shortness of breath, fever, cough and chest pain at PAT appointment   Patient verbalized understanding of instructions that were reviewed over the telephone.

## 2024-08-12 ENCOUNTER — Encounter (HOSPITAL_COMMUNITY): Payer: Self-pay | Admitting: Orthopedic Surgery

## 2024-08-12 ENCOUNTER — Ambulatory Visit (HOSPITAL_COMMUNITY): Admitting: Certified Registered Nurse Anesthetist

## 2024-08-12 ENCOUNTER — Encounter (HOSPITAL_COMMUNITY): Admission: RE | Payer: Self-pay | Source: Ambulatory Visit

## 2024-08-12 ENCOUNTER — Other Ambulatory Visit: Payer: Self-pay

## 2024-08-12 ENCOUNTER — Observation Stay (HOSPITAL_COMMUNITY)
Admission: RE | Admit: 2024-08-12 | Discharge: 2024-08-13 | Disposition: A | Discharge status: Home / Self Care | Payer: Self-pay | Source: Ambulatory Visit | Attending: Orthopedic Surgery | Admitting: Orthopedic Surgery

## 2024-08-12 DIAGNOSIS — M1712 Unilateral primary osteoarthritis, left knee: Secondary | ICD-10-CM

## 2024-08-12 DIAGNOSIS — Z96651 Presence of right artificial knee joint: Secondary | ICD-10-CM | POA: Diagnosis not present

## 2024-08-12 DIAGNOSIS — Z96652 Presence of left artificial knee joint: Principal | ICD-10-CM

## 2024-08-12 DIAGNOSIS — F17210 Nicotine dependence, cigarettes, uncomplicated: Secondary | ICD-10-CM | POA: Insufficient documentation

## 2024-08-12 MED ORDER — STERILE WATER FOR IRRIGATION IR SOLN
Status: DC | PRN
  Administered 2024-08-12: 2000 mL

## 2024-08-12 MED ORDER — MEPIVACAINE HCL (PF) 2 % IJ SOLN
INTRAMUSCULAR | Status: DC | PRN
  Administered 2024-08-12: 3.5 mL via INTRATHECAL

## 2024-08-12 MED ORDER — METOCLOPRAMIDE HCL 5 MG/ML IJ SOLN: 5.0000 mg | Freq: Three times a day (TID) | INTRAMUSCULAR | Status: DC | PRN

## 2024-08-12 MED ORDER — KETOROLAC TROMETHAMINE 30 MG/ML IJ SOLN
INTRAMUSCULAR | Status: AC
  Filled 2024-08-12: qty 1

## 2024-08-12 MED ORDER — DIPHENHYDRAMINE HCL 12.5 MG/5ML PO ELIX: 12.5000 mg | ORAL_SOLUTION | ORAL | Status: DC | PRN

## 2024-08-12 MED ORDER — FENTANYL CITRATE (PF) 100 MCG/2ML IJ SOLN
INTRAMUSCULAR | Status: DC | PRN
  Administered 2024-08-12 (×2): 50 ug via INTRAVENOUS

## 2024-08-12 MED ORDER — SENNA 8.6 MG PO TABS
2.0000 | ORAL_TABLET | Freq: Every day | ORAL | Status: DC
  Filled 2024-08-12: qty 2

## 2024-08-12 MED ORDER — ACETAMINOPHEN 500 MG PO TABS
1000.0000 mg | ORAL_TABLET | Freq: Four times a day (QID) | ORAL | Status: DC
  Administered 2024-08-12 – 2024-08-13 (×3): 1000 mg via ORAL
  Filled 2024-08-12 (×3): qty 2

## 2024-08-12 MED ORDER — ORAL CARE MOUTH RINSE: 15.0000 mL | Freq: Once | OROMUCOSAL | Status: AC

## 2024-08-12 MED ORDER — OXYCODONE HCL 5 MG PO TABS
5.0000 mg | ORAL_TABLET | ORAL | Status: DC | PRN
  Administered 2024-08-12 – 2024-08-13 (×2): 5 mg via ORAL
  Filled 2024-08-12 (×2): qty 1

## 2024-08-12 MED ORDER — ONDANSETRON HCL 4 MG PO TABS: 4.0000 mg | ORAL_TABLET | Freq: Four times a day (QID) | ORAL | Status: DC | PRN

## 2024-08-12 MED ORDER — OXYCODONE HCL 5 MG/5ML PO SOLN: 5.0000 mg | Freq: Once | ORAL | Status: DC | PRN

## 2024-08-12 MED ORDER — CELECOXIB 200 MG PO CAPS
200.0000 mg | ORAL_CAPSULE | Freq: Two times a day (BID) | ORAL | Status: DC
  Administered 2024-08-12 – 2024-08-13 (×2): 200 mg via ORAL
  Filled 2024-08-12 (×2): qty 1

## 2024-08-12 MED ORDER — METHOCARBAMOL 1000 MG/10ML IJ SOLN: 500.0000 mg | Freq: Four times a day (QID) | INTRAMUSCULAR | Status: DC | PRN

## 2024-08-12 MED ORDER — SODIUM CHLORIDE (PF) 0.9 % IJ SOLN
INTRAMUSCULAR | Status: AC
  Filled 2024-08-12: qty 30

## 2024-08-12 MED ORDER — FENTANYL CITRATE (PF) 50 MCG/ML IJ SOSY
100.0000 ug | PREFILLED_SYRINGE | INTRAMUSCULAR | Status: AC
  Administered 2024-08-12: 50 ug via INTRAVENOUS
  Filled 2024-08-12: qty 2

## 2024-08-12 MED ORDER — POLYETHYLENE GLYCOL 3350 17 G PO PACK
17.0000 g | PACK | Freq: Two times a day (BID) | ORAL | Status: DC
  Filled 2024-08-12 (×2): qty 1

## 2024-08-12 MED ORDER — OXYCODONE HCL 5 MG PO TABS: 5.0000 mg | ORAL_TABLET | Freq: Once | ORAL | Status: DC | PRN

## 2024-08-12 MED ORDER — ONDANSETRON HCL 4 MG/2ML IJ SOLN
INTRAMUSCULAR | Status: AC
  Filled 2024-08-12: qty 2

## 2024-08-12 MED ORDER — PROPOFOL 500 MG/50ML IV EMUL
INTRAVENOUS | Status: DC | PRN
  Administered 2024-08-12: 150 ug/kg/min via INTRAVENOUS

## 2024-08-12 MED ORDER — LACTATED RINGERS IV SOLN: INTRAVENOUS | Status: DC

## 2024-08-12 MED ORDER — ONDANSETRON HCL 4 MG/2ML IJ SOLN
INTRAMUSCULAR | Status: DC | PRN
  Administered 2024-08-12: 4 mg via INTRAVENOUS

## 2024-08-12 MED ORDER — PROPOFOL 10 MG/ML IV BOLUS
INTRAVENOUS | Status: DC | PRN
  Administered 2024-08-12 (×5): 20 mg via INTRAVENOUS

## 2024-08-12 MED ORDER — CHLORHEXIDINE GLUCONATE 0.12 % MT SOLN
15.0000 mL | Freq: Once | OROMUCOSAL | Status: AC
  Administered 2024-08-12: 15 mL via OROMUCOSAL

## 2024-08-12 MED ORDER — SODIUM CHLORIDE (PF) 0.9 % IJ SOLN
INTRAMUSCULAR | Status: DC | PRN
  Administered 2024-08-12: 61 mL

## 2024-08-12 MED ORDER — PHENOL 1.4 % MT LIQD: 1.0000 | OROMUCOSAL | Status: DC | PRN

## 2024-08-12 MED ORDER — TRANEXAMIC ACID-NACL 1000-0.7 MG/100ML-% IV SOLN
1000.0000 mg | Freq: Once | INTRAVENOUS | Status: AC
  Administered 2024-08-12: 1000 mg via INTRAVENOUS
  Filled 2024-08-12: qty 100

## 2024-08-12 MED ORDER — ACETAMINOPHEN 325 MG PO TABS: 325.0000 mg | ORAL_TABLET | Freq: Four times a day (QID) | ORAL | Status: DC | PRN

## 2024-08-12 MED ORDER — MIDAZOLAM HCL 2 MG/2ML IJ SOLN
INTRAMUSCULAR | Status: AC
  Filled 2024-08-12: qty 2

## 2024-08-12 MED ORDER — SODIUM CHLORIDE 0.9 % IV SOLN: INTRAVENOUS | Status: DC

## 2024-08-12 MED ORDER — METHOCARBAMOL 500 MG PO TABS
500.0000 mg | ORAL_TABLET | Freq: Four times a day (QID) | ORAL | Status: DC | PRN
  Administered 2024-08-12 – 2024-08-13 (×2): 500 mg via ORAL
  Filled 2024-08-12 (×2): qty 1

## 2024-08-12 MED ORDER — POVIDONE-IODINE 10 % EX SWAB: 2.0000 | Freq: Once | CUTANEOUS | Status: DC

## 2024-08-12 MED ORDER — TRANEXAMIC ACID-NACL 1000-0.7 MG/100ML-% IV SOLN
1000.0000 mg | INTRAVENOUS | Status: AC
  Administered 2024-08-12: 1000 mg via INTRAVENOUS
  Filled 2024-08-12: qty 100

## 2024-08-12 MED ORDER — MENTHOL 3 MG MT LOZG: 1.0000 | LOZENGE | OROMUCOSAL | Status: DC | PRN

## 2024-08-12 MED ORDER — CEFAZOLIN SODIUM-DEXTROSE 2-4 GM/100ML-% IV SOLN
2.0000 g | Freq: Four times a day (QID) | INTRAVENOUS | Status: AC
  Administered 2024-08-12 (×2): 2 g via INTRAVENOUS
  Filled 2024-08-12 (×2): qty 100

## 2024-08-12 MED ORDER — OXYCODONE HCL 5 MG PO TABS: 10.0000 mg | ORAL_TABLET | ORAL | Status: DC | PRN

## 2024-08-12 MED ORDER — MIDAZOLAM HCL 5 MG/5ML IJ SOLN
INTRAMUSCULAR | Status: DC | PRN
  Administered 2024-08-12 (×2): 1 mg via INTRAVENOUS

## 2024-08-12 MED ORDER — MIDAZOLAM HCL (PF) 2 MG/2ML IJ SOLN
2.0000 mg | INTRAMUSCULAR | Status: AC
  Administered 2024-08-12: 1 mg via INTRAVENOUS
  Filled 2024-08-12: qty 2

## 2024-08-12 MED ORDER — ONDANSETRON HCL 4 MG/2ML IJ SOLN: 4.0000 mg | Freq: Four times a day (QID) | INTRAMUSCULAR | Status: DC | PRN

## 2024-08-12 MED ORDER — METOCLOPRAMIDE HCL 5 MG PO TABS: 5.0000 mg | ORAL_TABLET | Freq: Three times a day (TID) | ORAL | Status: DC | PRN

## 2024-08-12 MED ORDER — ROPIVACAINE HCL 5 MG/ML IJ SOLN
INTRAMUSCULAR | Status: DC | PRN
  Administered 2024-08-12: 25 mL via PERINEURAL

## 2024-08-12 MED ORDER — ALUM & MAG HYDROXIDE-SIMETH 200-200-20 MG/5ML PO SUSP: 30.0000 mL | ORAL | Status: DC | PRN

## 2024-08-12 MED ORDER — PHENYLEPHRINE HCL-NACL 20-0.9 MG/250ML-% IV SOLN
INTRAVENOUS | Status: DC | PRN
  Administered 2024-08-12: 40 ug/min via INTRAVENOUS

## 2024-08-12 MED ORDER — SODIUM CHLORIDE 0.9 % IR SOLN
Status: DC | PRN
  Administered 2024-08-12: 1000 mL

## 2024-08-12 MED ORDER — DEXAMETHASONE SOD PHOSPHATE PF 10 MG/ML IJ SOLN
10.0000 mg | Freq: Once | INTRAMUSCULAR | Status: AC
  Administered 2024-08-13: 10 mg via INTRAVENOUS
  Filled 2024-08-12: qty 1

## 2024-08-12 MED ORDER — PROPOFOL 1000 MG/100ML IV EMUL
INTRAVENOUS | Status: AC
  Filled 2024-08-12: qty 100

## 2024-08-12 MED ORDER — FENTANYL CITRATE (PF) 100 MCG/2ML IJ SOLN
INTRAMUSCULAR | Status: AC
  Filled 2024-08-12: qty 2

## 2024-08-12 MED ORDER — CEFAZOLIN SODIUM-DEXTROSE 2-4 GM/100ML-% IV SOLN
2.0000 g | INTRAVENOUS | Status: AC
  Administered 2024-08-12: 2 g via INTRAVENOUS
  Filled 2024-08-12: qty 100

## 2024-08-12 MED ORDER — MEPIVACAINE HCL (PF) 2 % IJ SOLN
INTRAMUSCULAR | Status: AC
  Filled 2024-08-12: qty 20

## 2024-08-12 MED ORDER — BISACODYL 10 MG RE SUPP: 10.0000 mg | Freq: Every day | RECTAL | Status: DC | PRN

## 2024-08-12 MED ORDER — ASPIRIN 81 MG PO CHEW
81.0000 mg | CHEWABLE_TABLET | Freq: Two times a day (BID) | ORAL | Status: DC
  Administered 2024-08-12 – 2024-08-13 (×2): 81 mg via ORAL
  Filled 2024-08-12 (×2): qty 1

## 2024-08-12 MED ORDER — DEXAMETHASONE SOD PHOSPHATE PF 10 MG/ML IJ SOLN
8.0000 mg | Freq: Once | INTRAMUSCULAR | Status: AC
  Administered 2024-08-12: 5 mg via INTRAVENOUS

## 2024-08-12 MED ORDER — HYDROMORPHONE HCL 1 MG/ML IJ SOLN: 0.5000 mg | INTRAMUSCULAR | Status: DC | PRN

## 2024-08-12 MED ORDER — 0.9 % SODIUM CHLORIDE (POUR BTL) OPTIME
TOPICAL | Status: DC | PRN
  Administered 2024-08-12: 1000 mL

## 2024-08-12 MED ORDER — BUPIVACAINE-EPINEPHRINE (PF) 0.25% -1:200000 IJ SOLN
INTRAMUSCULAR | Status: AC
  Filled 2024-08-12: qty 30

## 2024-08-12 MED ORDER — FENTANYL CITRATE (PF) 50 MCG/ML IJ SOSY: 25.0000 ug | PREFILLED_SYRINGE | INTRAMUSCULAR | Status: DC | PRN

## 2024-08-12 NOTE — Anesthesia Procedure Notes (Signed)
 Procedure Name: MAC Date/Time: 08/12/2024 9:52 AM  Performed by: Franchot Delon RAMAN, CRNAPre-anesthesia Checklist: Patient identified, Emergency Drugs available, Suction available and Patient being monitored Oxygen Delivery Method: Simple face mask Ventilation: Oral airway inserted - appropriate to patient size Placement Confirmation: positive ETCO2 Dental Injury: Teeth and Oropharynx as per pre-operative assessment

## 2024-08-12 NOTE — H&P (Signed)
 TOTAL KNEE ADMISSION H&P  Patient is being admitted for left total knee arthroplasty.  Therapy Plans: EO [place] Disposition: Home with wife Planned DVT Prophylaxis: aspirin  81mg  BID DME needed: none PCP: Layla Woodson - clearance received TXA: IV Allergies: NKDA Anesthesia Concerns: none BMI: 39.2 Last HgbA1c: 6.3%   Other: - staying overnight - no hx of VTE or cancer - oxycodone , robaxin , tylenol , celebrex   Subjective:  Chief Complaint: Left knee pain.  HPI: Mike Elsie Inetta Mickey., 59 y.o. male has a history of pain and functional disability in the left knee due to osteoarthritis and has failed non-surgical conservative treatments for greater than 12 weeks to include activity modification. Onset of symptoms was gradual, starting 2 years ago with gradually worsening course since that time. The patient noted no past surgery on the left knee.  Patient currently rates pain in the left knee at 8 out of 10 with activity. Patient has worsening of pain with activity and weight bearing and pain that interferes with activities of daily living. Patient has evidence of joint space narrowing by imaging studies. There is no active infection.  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   Cervical stenosis of spinal canal 03/04/2021    Past Medical History:  Diagnosis Date   Arthritis    Medical history non-contributory    Pre-diabetes     Past Surgical History:  Procedure Laterality Date   ANTERIOR CERVICAL CORPECTOMY N/A 03/04/2021   Procedure: Cervical five Corpectomy;  Surgeon: Gillie Duncans, MD;  Location: [facility] OR;  Service: Neurosurgery;  Laterality: N/A;   TONSILLECTOMY     TOTAL KNEE ARTHROPLASTY Right 07/01/2024   Procedure: ARTHROPLASTY, KNEE, TOTAL;  Surgeon: Ernie Cough, MD;  Location: [facility] ORS;  Service: Orthopedics;  Laterality: Right;    Prior to Admission medications  Medication Sig Start Date End Date  Taking? Authorizing Provider  acetaminophen  (TYLENOL ) 500 MG tablet Take 1,000 mg by mouth daily in the afternoon.   Yes [provider]  diclofenac (VOLTAREN) 75 MG EC tablet Take 75 mg by mouth 2 (two) times daily.   Yes [provider]  methocarbamol  (ROBAXIN ) 500 MG tablet Take 1 tablet (500 mg total) by mouth every 6 (six) hours as needed for muscle spasms. 07/02/24  Yes Patti Rosina SAUNDERS, PA-C    Allergies[1]  Social History   Socioeconomic History   Marital status: Married    Spouse name: Not on file   Number of children: Not on file   Years of education: Not on file   Highest education level: Not on file  Occupational History   Not on file  Tobacco Use   Smoking status: Every Day    Current packs/day: 0.25    Average packs/day: 0.3 packs/day for 30.0 years (7.5 ttl pk-yrs)    Types: Cigarettes   Smokeless tobacco: Never  Vaping Use   Vaping status: Never Used  Substance and Sexual Activity   Alcohol use: Yes    Alcohol/week: 2.0 standard drinks of alcohol    Types: 2 Cans of beer per week    Comment: 2 beers per week   Drug use: Never   Sexual activity: Yes  Other Topics Concern   Not on file  Social History Narrative   Not on file   Social Drivers of Health   Tobacco Use: High Risk (08/05/2024)   Patient History    Smoking Tobacco Use: Every Day    Smokeless Tobacco  Use: Never    Passive Exposure: Not on file  Financial Resource Strain: Not on file  Food Insecurity: No Food Insecurity (07/01/2024)   Epic    Worried About Programme Researcher, Broadcasting/film/video in the Last Year: Never true    Ran Out of Food in the Last Year: Never true  Transportation Needs: No Transportation Needs (07/01/2024)   Epic    Lack of Transportation (Medical): No    Lack of Transportation (Non-Medical): No  Physical Activity: Not on file  Stress: Not on file  Social Connections: Unknown (11/28/2021)   [facility]   Social Network    Social Network: Not on file   Intimate Partner Violence: Not At Risk (07/01/2024)   Epic    Fear of Current or Ex-Partner: No    Emotionally Abused: No    Physically Abused: No    Sexually Abused: No  Depression (PHQ2-9): Not on file  Alcohol Screen: Not on file  Housing: Low Risk (07/01/2024)   Epic    Unable to Pay for Housing in the Last Year: No    Number of Times Moved in the Last Year: 0    Homeless in the Last Year: No  Utilities: Not At Risk (07/01/2024)   Epic    Threatened with loss of utilities: No  Health Literacy: Not on file    Tobacco Use: High Risk (08/05/2024)   Patient History    Smoking Tobacco Use: Every Day    Smokeless Tobacco Use: Never    Passive Exposure: Not on file   Social History   Substance and Sexual Activity  Alcohol Use Yes   Alcohol/week: 2.0 standard drinks of alcohol   Types: 2 Cans of beer per week   Comment: 2 beers per week    Family History  Problem Relation Age of Onset   Cancer Mother        lung   Cancer Father        prostate    Review Of Systems: Constitutional: Constitutional: no fever, chills, night sweats, or significant weight loss. Cardiovascular: Cardiovascular: no palpitations or chest pain. Respiratory: Respiratory: no cough or shortness of breath and No COPD. Gastrointestinal: Gastrointestinal: no vomiting or nausea. Musculoskeletal: Musculoskeletal: Joint Pain and swelling in Joints. Neurologic: Neurologic: no numbness, tingling, or difficulty with balance.  Objective:  Physical Exam: Left knee genu varum Slight flexion contracture with flexion over 110 degrees with tightness and crepitation over the anterior medial aspect the knee Stable medial and lateral collateral ligaments No significant lower extremity edema, erythema or calf tenderness  Vital signs in last 24 hours:    Imaging Review Plain radiographs demonstrate severe degenerative joint disease of the left knee.  The bone quality appears to be adequate for age and  reported activity level.  Assessment/Plan:  End stage arthritis, left knee   The patient history, physical examination, clinical judgment of the provider and imaging studies are consistent with end stage degenerative joint disease of the left knee and total knee arthroplasty is deemed medically necessary. The treatment options including medical management, injection therapy arthroscopy and arthroplasty were discussed at length. The risks and benefits of total knee arthroplasty were presented and reviewed. The risks due to aseptic loosening, infection, stiffness, patella tracking problems, thromboembolic complications and other imponderables were discussed. The patient acknowledged the explanation, agreed to proceed with the plan and consent was signed. Patient is being admitted for inpatient treatment for surgery, pain control, PT, OT, prophylactic antibiotics, VTE prophylaxis, progressive  ambulation and ADLs and discharge planning. The patient is planning to be discharged home.   Patient's anticipated LOS is less than 2 midnights, meeting these requirements: - Younger than 71 - Lives within 1 hour of care - Has a competent adult at home to recover with post-op recover - NO history of  - Chronic pain requiring opiods  - Diabetes  - Coronary Artery Disease  - Heart failure  - Heart attack  - Stroke  - DVT/VTE  - Cardiac arrhythmia  - Respiratory Failure/COPD  - Renal failure  - Anemia  - Advanced Liver disease    Rosina Calin, PA-C Orthopedic Surgery [facility] [place] [phone]      [1] No Known Allergies

## 2024-08-12 NOTE — Transfer of Care (Signed)
 Immediate Anesthesia Transfer of Care Note  Patient: Mike Hodge.  Procedure(s) Performed: ARTHROPLASTY, KNEE, TOTAL (Left: Knee)  Patient Location: PACU  Anesthesia Type:Regional and Spinal  Level of Consciousness: awake, alert , oriented, and patient cooperative  Airway & Oxygen Therapy: Patient Spontanous Breathing and Patient connected to face mask oxygen  Post-op Assessment: Report given to RN and Post -op Vital signs reviewed and stable  Post vital signs: Reviewed and stable  Last Vitals:  Vitals Value Taken Time  BP 116/81 08/12/24 11:45  Temp    Pulse 81 08/12/24 11:46  Resp 19 08/12/24 11:46  SpO2 98 % 08/12/24 11:46  Vitals shown include unfiled device data.  Last Pain:  Vitals:   08/12/24 0735  TempSrc: Oral         Complications: No notable events documented.

## 2024-08-12 NOTE — Op Note (Signed)
 " NAME:  Mike Hodge.                      MEDICAL RECORD NO.:  [id number]                             FACILITY:  [facility]      PHYSICIAN:  Donnice BIRCH. Ernie, M.D.  DATE OF BIRTH:  06/10/1966      DATE OF PROCEDURE:  08/12/2024                                     OPERATIVE REPORT         PREOPERATIVE DIAGNOSIS:  left knee osteoarthritis.      POSTOPERATIVE DIAGNOSIS:  left knee osteoarthritis.      FINDINGS:  The patient was noted to have complete loss of cartilage and   bone-on-bone arthritis with associated osteophytes in the medial and patellofemoral compartments of   the knee with a significant synovitis and associated effusion.  The patient had failed months of conservative treatment including medications, injection therapy, activity modification.     PROCEDURE:  left total knee replacement.      COMPONENTS USED:  DePuy Attune fixed bearing cruciate retaining medial stabilized knee   system, a size 6 femur, 7 tibia, size 10 mm CR MS AOX insert, and 38 anatomic patellar   button.      SURGEON:  Donnice BIRCH. Ernie, M.D.      ASSISTANT:  Rosina Calin, PA-C.      ANESTHESIA:  Regional and Spinal.      SPECIMENS:  None.      COMPLICATION:  None.      DRAINS:  None.  EBL: <200 cc      TOURNIQUET TIME:  tourniquet was not used      The patient was stable to the recovery room.      INDICATION FOR PROCEDURE:  Mike Hodge. is a 59 y.o. male patient of   mine.  The patient had been seen, evaluated, and treated for months conservatively in the   office with medication, activity modification, and injections.  The patient had   radiographic changes of complete loss of joint space with endplate sclerosis and osteophytes noted.  Based on the radiographic changes and failed conservative measures, the patient   decided to proceed with total knee replacement as definitive treatment.  Risks of infection, DVT, component failure, stiffness and the need for revision  surgery, neurovascular injury were reviewed in the office setting.  The postop course was reviewed stressing the efforts to maximize post-operative range of motion, satisfaction and function.  Consent was obtained for benefit of pain   relief.      PROCEDURE IN DETAIL:  The patient was brought to the operative theater.   Once adequate anesthesia, preoperative antibiotics, 2 gm of Ancef ,1 gm of Tranexamic Acid , and 10 mg of Decadron  administered, the patient was positioned supine with bony prominences padded and protected.  The  left lower extremity was prepped and draped in sterile fashion.  A time-   out was performed identifying the patient, planned procedure, and the appropriate extremity.      The left lower extremity was placed in the [facility] leg holder.  A midline incision was   made followed by median parapatellar arthrotomy.  Following initial  exposure, attention was first directed to the patella.  Precut   measurement was noted to be 26 mm.  I resected down to 14 mm and used a   38 anatomic patellar button to restore patellar height as well as cover the cut surface.  Limited lateral facetecomy was performed.     The lug holes were drilled and a metal shim was placed to protect the   patella from retractors and saw blade during the procedure.      At this point, attention was now directed to the femur.  The femoral   canal was opened with a drill, irrigated to try to prevent fat emboli.  An   intramedullary rod was passed at 5 degrees valgus, 9 mm of bone was   resected off the distal femur.  Following this resection, the tibia was   subluxated anteriorly.  Using the extramedullary guide, 2 mm of bone was resected off   the proximal medial tibia.  We confirmed the gap would be   stable medially and laterally with a size 6 spacer block as well as confirmed that the tibial cut was perpendicular in the coronal plane, checking with an alignment rod.      Once this was done, I sized the  femur to be a size 6 in the anterior-   posterior dimension and chose a standard component based on medial and   lateral dimension.  The size 6 rotation block was then pinned in   position anterior referenced using the C-clamp to set rotation.  The   anterior, posterior, and  chamfer cuts were made without difficulty nor   notching making certain that I was along the anterior cortex to help   with flexion gap stability.      The final femoral shim cut was made off the lateral aspect of distal femur.      At this point, the tibia was sized to be a size 7.  The size 7 tray was   then pinned in position through the medial third of the tubercle,   drilled, and keel punched.  Trial reduction was now carried with a 6 femur,  7 tibia, a size 10 mm CR insert, and the 38 anatomic patella botton.  The knee was brought to full extension with good flexion stability with the patella tracking through the trochlea without application of pressure.  Given   all these findings the trial components removed.  Final components were   opened and cement was mixed.  The knee was irrigated with normal saline solution and pulse lavage.  The posterior synovial capsule was then injected with 30 cc of 0.25% Marcaine  with epinephrine , 1 cc of Toradol  and 30 cc of NS for a total of 61 cc.     Final implants were then cemented onto cleaned and dried cut surfaces of bone with the knee brought to extension with a size 10 mm CR trial insert.      Once the cement had fully cured, excessive cement was removed   throughout the knee.  I confirmed that I was satisfied with the range of   motion and stability, and the final size 10 mm CR MS AOX insert was chosen and it was impacted into the tibial tray.     At this point in the case no significant   hemostasis was required.  The extensor mechanism was then reapproximated using #1 Vicryl and #1 Stratafix sutures with the knee   in flexion.  The   remaining wound was closed with 2-0  Vicryl and running 4-0 Monocryl.   The knee was cleaned, dried, dressed sterilely using Dermabond and   Aquacel dressing.  The patient was then   brought to recovery room in stable condition, tolerating the procedure   well.   Please note that PA Patti was present for the entirety of the case, and was utilized for pre-operative positioning, peri-operative retractor management, general facilitation of the procedure and for primary wound closure at the end of the case.              Donnice CORDOBA Ernie, M.D.    08/12/2024 11:22 AM "

## 2024-08-12 NOTE — Anesthesia Preprocedure Evaluation (Signed)
"                                    Anesthesia Evaluation  Patient identified by MRN, date of birth, ID band Patient awake    Reviewed: Allergy & Precautions, H&P , NPO status , Patient's Chart, lab work & pertinent test results  Airway Mallampati: II   Neck ROM: full    Dental   Pulmonary Current Smoker   breath sounds clear to auscultation       Cardiovascular negative cardio ROS  Rhythm:regular Rate:Normal     Neuro/Psych    GI/Hepatic   Endo/Other    Renal/GU      Musculoskeletal  (+) Arthritis ,    Abdominal   Peds  Hematology   Anesthesia Other Findings   Reproductive/Obstetrics                              Anesthesia Physical Anesthesia Plan  ASA: 2  Anesthesia Plan: MAC and Spinal   Post-op Pain Management: Regional block*   Induction: Intravenous  PONV Risk Score and Plan: 0 and Ondansetron , Propofol  infusion, Treatment may vary due to age or medical condition and Midazolam   Airway Management Planned: Simple Face Mask  Additional Equipment:   Intra-op Plan:   Post-operative Plan:   Informed Consent: I have reviewed the patients History and Physical, chart, labs and discussed the procedure including the risks, benefits and alternatives for the proposed anesthesia with the patient or authorized representative who has indicated his/her understanding and acceptance.     Dental advisory given  Plan Discussed with: CRNA, Anesthesiologist and Surgeon  Anesthesia Plan Comments:         Anesthesia Quick Evaluation  "

## 2024-08-12 NOTE — Discharge Instructions (Signed)

## 2024-08-12 NOTE — Interval H&P Note (Signed)
 History and Physical Interval Note:  08/12/2024 8:32 AM  Mike Hodge.  has presented today for surgery, with the diagnosis of Left knee osteoarthritis.  The various methods of treatment have been discussed with the patient and family. After consideration of risks, benefits and other options for treatment, the patient has consented to  Procedures: ARTHROPLASTY, KNEE, TOTAL (Left) as a surgical intervention.  The patient's history has been reviewed, patient examined, no change in status, stable for surgery.  I have reviewed the patient's chart and labs.  Questions were answered to the patient's satisfaction.     [name]

## 2024-08-12 NOTE — Care Plan (Signed)
 Ortho Bundle Case Management Note  Patient Details  Name: Mike Hodge. MRN: [id number] Date of Birth: Feb 22, 1966  LT TKA 08/12/24  DCP: Home with wife  DME: No needs  PT: OLIVA Dadds                   DME Arranged:  N/A DME Agency:  NA  HH Arranged:    HH Agency:     Additional Comments: Please contact me with any questions of if this plan should need to change.  Lyle Pepper, CCM [facility] [phone]  Ext. [phone]    08/12/2024, 8:15 AM

## 2024-08-12 NOTE — Evaluation (Signed)
 Physical Therapy Evaluation Patient Details Name: Mike Hodge. MRN: [id number] DOB: 1966/06/12 Today's Date: 08/12/2024  History of Present Illness  59 yo male presents to therapy s/p L TKA on 08/12/2024 due to failure of conservative measures. Pt PMH includes but is not limited to: R TKA 912/2025), cervical stenosis, tobacco abuse, and arthritis.  Clinical Impression    Jalen Daluz. is a 59 y.o. male POD 0 s/p L TKA. Patient reports IND with mobility at baseline. Patient is now limited by functional impairments (see PT problem list below) and requires min A for bed mobility and CGA for transfers. Patient was able to ambulate 50 feet with RW and CGA level of assist. Patient instructed in exercise to facilitate ROM and circulation to manage edema. Patient will benefit from continued skilled PT interventions to address impairments and progress towards PLOF. Acute PT will follow to progress mobility and stair training in preparation for safe discharge home with family support and OPPT services.       If plan is discharge home, recommend the following: A little help with walking and/or transfers;A little help with bathing/dressing/bathroom;Assistance with cooking/housework;Assist for transportation   Can travel by private vehicle        Equipment Recommendations None recommended by PT  Recommendations for Other Services       Functional Status Assessment Patient has had a recent decline in their functional status and demonstrates the ability to make significant improvements in function in a reasonable and predictable amount of time.     Precautions / Restrictions Precautions Precautions: Fall;Knee Restrictions Weight Bearing Restrictions Per Provider Order: No      Mobility  Bed Mobility Overal bed mobility: Needs Assistance Bed Mobility: Sidelying to Sit   Sidelying to sit: Min assist       General bed mobility comments: cues and min A for L LE to EOB,  HOB elevated    Transfers Overall transfer level: Needs assistance Equipment used: Rolling walker (2 wheels) Transfers: Sit to/from Stand Sit to Stand: Contact guard assist           General transfer comment: min cues elevated EOB    Ambulation/Gait Ambulation/Gait assistance: Contact guard assist Gait Distance (Feet): 50 Feet Assistive device: Rolling walker (2 wheels) Gait Pattern/deviations: Step-to pattern, Decreased stance time - left, Antalgic, Trunk flexed Gait velocity: decreased     General Gait Details: slight trunk flexion with B UE support at RW to offload l LE in stance phase, min cues for safety and RW management  Stairs            Wheelchair Mobility     Tilt Bed    Modified Rankin (Stroke Patients Only)       Balance Overall balance assessment: Needs assistance Sitting-balance support: Feet supported Sitting balance-Leahy Scale: Good     Standing balance support: Bilateral upper extremity supported, Reliant on assistive device for balance, During functional activity Standing balance-Leahy Scale: Poor                               Pertinent Vitals/Pain Pain Assessment Pain Assessment: 0-10 Pain Score: 5  Pain Location: L knee and LE Pain Descriptors / Indicators: Aching, Constant, Discomfort, Grimacing, Operative site guarding, Sore Pain Intervention(s): Limited activity within patient's tolerance, Monitored during session, Premedicated before session, Repositioned, Ice applied    Home Living Family/patient expects to be discharged to:: Private residence Living Arrangements: Spouse/significant other Available  Help at Discharge: Family;Available 24 hours/day Type of Home: House Home Access: Level entry       Home Layout: One level Home Equipment: Agricultural Consultant (2 wheels);Cane - single point;Shower seat      Prior Function Prior Level of Function : Independent/Modified Independent;Driving             Mobility  Comments: IND no AD for all ADLs, self care tasks and IADLs       Extremity/Trunk Assessment        Lower Extremity Assessment Lower Extremity Assessment: LLE deficits/detail LLE Deficits / Details: ankle DF/PF 5/5; SLR < 10 degree lag LLE Sensation: WNL    Cervical / Trunk Assessment Cervical / Trunk Assessment: Normal  Communication   Communication Communication: No apparent difficulties    Cognition Arousal: Alert Behavior During Therapy: WFL for tasks assessed/performed   PT - Cognitive impairments: No apparent impairments                         Following commands: Intact       Cueing       General Comments      Exercises Total Joint Exercises Ankle Circles/Pumps: AROM, Both, 10 reps, Supine   Assessment/Plan    PT Assessment Patient needs continued PT services  PT Problem List Decreased strength;Decreased range of motion;Decreased activity tolerance;Decreased mobility;Decreased balance;Decreased coordination;Pain       PT Treatment Interventions DME instruction;Therapeutic exercise;Gait training;Stair training;Functional mobility training;Therapeutic activities;Patient/family education;Modalities;Balance training    PT Goals (Current goals can be found in the Care Plan section)  Acute Rehab PT Goals Patient Stated Goal: to be able to get back out in the yard and be more mobile PT Goal Formulation: With patient/family Time For Goal Achievement: 08/26/24 Potential to Achieve Goals: Good    Frequency 7X/week     Co-evaluation               AM-PAC PT 6 Clicks Mobility  Outcome Measure Help needed turning from your back to your side while in a flat bed without using bedrails?: None Help needed moving from lying on your back to sitting on the side of a flat bed without using bedrails?: A Little Help needed moving to and from a bed to a chair (including a wheelchair)?: A Little Help needed standing up from a chair using your arms  (e.g., wheelchair or bedside chair)?: A Little Help needed to walk in hospital room?: A Little Help needed climbing 3-5 steps with a railing? : A Lot 6 Click Score: 18    End of Session Equipment Utilized During Treatment: Gait belt Activity Tolerance: Patient tolerated treatment well Patient left: with call bell/phone within reach;in chair Nurse Communication: Mobility status PT Visit Diagnosis: Other abnormalities of gait and mobility (R26.89);Muscle weakness (generalized) (M62.81)    Time: 8255-8190 PT Time Calculation (min) (ACUTE ONLY): 25 min   Charges:   PT Evaluation $PT Eval Low Complexity: 1 Low PT Treatments $Gait Training: 8-22 mins PT General Charges $$ ACUTE PT VISIT: 1 Visit         [place], PT Acute Rehab   [place] VEAR Drone 08/12/2024, 6:51 PM

## 2024-08-12 NOTE — Anesthesia Procedure Notes (Addendum)
 Spinal  Patient location during procedure: OR Start time: 08/12/2024 10:06 AM End time: 08/12/2024 10:10 AM Reason for block: surgical anesthesia  Staffing Performed: anesthesiologist  Authorized by: Maryclare Cornet, MD   Performed by: Maryclare Cornet, MD  Preanesthetic Checklist Completed: patient identified, IV checked, risks and benefits discussed, surgical consent, monitors and equipment checked, pre-op evaluation and timeout performed Spinal Block Patient position: sitting Prep: DuraPrep Patient monitoring: cardiac monitor, continuous pulse ox and blood pressure Approach: midline Location: L3-4 Injection technique: single-shot Needle Needle type: Pencan  Needle gauge: 24 G Needle length: 12.7 cm Assessment Sensory level: T10 Events: CSF return and second provider  Additional Notes Functioning IV was confirmed and monitors were applied. Sterile prep and drape, including hand hygiene and sterile gloves were used. The patient was positioned and the spine was prepped. The skin was anesthetized with lidocaine .  Free flow of clear CSF was obtained prior to injecting local anesthetic into the CSF.  The spinal needle aspirated freely following injection.  The needle was carefully withdrawn.  The patient tolerated the procedure well.

## 2024-08-12 NOTE — Anesthesia Procedure Notes (Signed)
 Anesthesia Regional Block: Adductor canal block   Pre-Anesthetic Checklist: , timeout performed,  Correct Patient, Correct Site, Correct Laterality,  Correct Procedure, Correct Position, site marked,  Risks and benefits discussed,  Surgical consent,  Pre-op evaluation,  At surgeon's request and post-op pain management  Laterality: Left  Prep: chloraprep       Needles:  Injection technique: Single-shot  Needle Type: Echogenic Needle     Needle Length: 9cm  Needle Gauge: 21     Additional Needles:   Narrative:  Start time: 08/12/2024 9:00 AM End time: 08/12/2024 9:10 AM Injection made incrementally with aspirations every 5 mL.  Performed by: Personally  Anesthesiologist: Maryclare Cornet, MD  Additional Notes: Pt tolerated the procedure well.

## 2024-08-13 ENCOUNTER — Encounter (HOSPITAL_COMMUNITY): Payer: Self-pay | Admitting: Orthopedic Surgery

## 2024-08-13 ENCOUNTER — Other Ambulatory Visit (HOSPITAL_COMMUNITY): Payer: Self-pay

## 2024-08-13 DIAGNOSIS — M1712 Unilateral primary osteoarthritis, left knee: Secondary | ICD-10-CM | POA: Diagnosis not present

## 2024-08-13 LAB — BASIC METABOLIC PANEL WITH GFR
Anion gap: 11 (ref 5–15)
BUN: 11 mg/dL (ref 6–20)
CO2: 21 mmol/L — ABNORMAL LOW (ref 22–32)
Calcium: 8.5 mg/dL — ABNORMAL LOW (ref 8.9–10.3)
Chloride: 106 mmol/L (ref 98–111)
Creatinine, Ser: 0.78 mg/dL (ref 0.61–1.24)
GFR, Estimated: >60 mL/min
Glucose, Bld: 130 mg/dL — ABNORMAL HIGH (ref 70–99)
Potassium: 4.3 mmol/L (ref 3.5–5.1)
Sodium: 138 mmol/L (ref 135–145)

## 2024-08-13 LAB — CBC
HCT: 39.8 % (ref 39.0–52.0)
Hemoglobin: 13.4 g/dL (ref 13.0–17.0)
MCH: 30 pg (ref 26.0–34.0)
MCHC: 33.7 g/dL (ref 30.0–36.0)
MCV: 89 fL (ref 80.0–100.0)
Platelets: 245 10*3/uL (ref 150–400)
RBC: 4.47 MIL/uL (ref 4.22–5.81)
RDW: 12.6 % (ref 11.5–15.5)
WBC: 12.5 10*3/uL — ABNORMAL HIGH (ref 4.0–10.5)
nRBC: 0 % (ref 0.0–0.2)

## 2024-08-13 MED ORDER — SENNA 8.6 MG PO TABS
2.0000 | ORAL_TABLET | Freq: Every day | ORAL | 0 refills | Status: AC
  Filled 2024-08-13: qty 28, 14d supply, fill #0

## 2024-08-13 MED ORDER — POLYETHYLENE GLYCOL 3350 17 GM/SCOOP PO POWD
17.0000 g | Freq: Two times a day (BID) | ORAL | 0 refills | Status: AC
  Filled 2024-08-13: qty 238, 7d supply, fill #0

## 2024-08-13 MED ORDER — ASPIRIN 81 MG PO CHEW
81.0000 mg | CHEWABLE_TABLET | Freq: Two times a day (BID) | ORAL | 0 refills | Status: AC
  Filled 2024-08-13: qty 56, 28d supply, fill #0

## 2024-08-13 MED ORDER — CELECOXIB 200 MG PO CAPS
200.0000 mg | ORAL_CAPSULE | Freq: Two times a day (BID) | ORAL | 0 refills | Status: AC
  Filled 2024-08-13: qty 60, 30d supply, fill #0

## 2024-08-13 MED ORDER — METHOCARBAMOL 500 MG PO TABS
500.0000 mg | ORAL_TABLET | Freq: Four times a day (QID) | ORAL | 0 refills | Status: AC | PRN
  Filled 2024-08-13: qty 40, 10d supply, fill #0

## 2024-08-13 MED ORDER — OXYCODONE HCL 5 MG PO TABS
5.0000 mg | ORAL_TABLET | ORAL | 0 refills | Status: AC | PRN
  Filled 2024-08-13: qty 42, 7d supply, fill #0

## 2024-08-13 MED ORDER — CEFADROXIL 500 MG PO CAPS
500.0000 mg | ORAL_CAPSULE | Freq: Two times a day (BID) | ORAL | 0 refills | Status: AC
  Filled 2024-08-13: qty 14, 7d supply, fill #0

## 2024-08-13 NOTE — Progress Notes (Signed)
 Discharge medications delivered to patient at the bedside in a secure bag.

## 2024-08-13 NOTE — TOC Transition Note (Signed)
 Transition of Care [facility]) - Discharge Note   Patient Details  Name: Mike Hodge. MRN: [id number] Date of Birth: Jan 25, 1966  Transition of Care [facility] CM/SW Contact:  NORMAN ASPEN, LCSW Phone Number: 08/13/2024, 9:36 AM   Clinical Narrative:     Met with pt who confirms he has needed DME in the home.  OPPT already set up with [facility]).  No further IP CM needs.  Final next level of care: OP Rehab Barriers to Discharge: No Barriers Identified   Patient Goals and CMS Choice Patient states their goals for this hospitalization and ongoing recovery are:: return home          Discharge Placement                       Discharge Plan and Services Additional resources added to the After Visit Summary for                  DME Arranged: N/A DME Agency: NA                  Social Drivers of Health (SDOH) Interventions SDOH Screenings   Food Insecurity: No Food Insecurity (08/12/2024)  Housing: Low Risk (08/12/2024)  Transportation Needs: No Transportation Needs (08/12/2024)  Utilities: Not At Risk (08/12/2024)  Social Connections: Unknown (11/28/2021)   Received from [facility]  Tobacco Use: High Risk (08/12/2024)     Readmission Risk Interventions     No data to display

## 2024-08-13 NOTE — Progress Notes (Signed)
 Physical Therapy Treatment Patient Details Name: Mike Hodge. MRN: [id number] DOB: 1965/12/23 Today's Date: 08/13/2024   History of Present Illness 59 yo male presents to therapy s/p L TKA on 08/12/2024 due to failure of conservative measures. Pt PMH includes but is not limited to: R TKA 912/2025), cervical stenosis, tobacco abuse, and arthritis.    PT Comments  POD # 1 am session PT - Cognition Comments: AxO x 3 pleasant and motivated.  Knowledgable as he had his other knee replaced last Decemeber. Assisted OOB.  General bed mobility comments: demonstarted and instructed how to use strap.  General Gait Details: slight trunk flextion with increased tolerance distance.  Min VC's on proper walker to self distance. General Gait Details: slight trunk flextion with increased tolerance distance.  Min VC's on proper walker to self distance.  Then returned to room to perform some TE's following HEP handout.  Instructed on proper tech, freq as well as use of ICE.   Addressed all mobility questions, discussed appropriate activity, educated on use of ICE.  Pt ready for D/C to home.     If plan is discharge home, recommend the following: A little help with walking and/or transfers;A little help with bathing/dressing/bathroom;Assistance with cooking/housework;Assist for transportation   Can travel by private vehicle        Equipment Recommendations  None recommended by PT    Recommendations for Other Services       Precautions / Restrictions Precautions Precautions: Fall;Knee Precaution/Restrictions Comments: no pillow under knee Restrictions Weight Bearing Restrictions Per Provider Order: No RLE Weight Bearing Per Provider Order: Weight bearing as tolerated     Mobility  Bed Mobility Overal bed mobility: Needs Assistance Bed Mobility: Supine to Sit     Supine to sit: Supervision     General bed mobility comments: demonstarted and instructed how to use strap     Transfers Overall transfer level: Needs assistance Equipment used: Rolling walker (2 wheels) Transfers: Sit to/from Stand Sit to Stand: Supervision           General transfer comment: min VC's on proper hand placement to push up vs pull on walker    Ambulation/Gait Ambulation/Gait assistance: Supervision Gait Distance (Feet): 75 Feet Assistive device: Rolling walker (2 wheels) Gait Pattern/deviations: Decreased stance time - left, Antalgic, Trunk flexed, Step-through pattern Gait velocity: decreased     General Gait Details: slight trunk flextion with increased tolerance distance.  Min VC's on proper walker to self distance.   Stairs Stairs:  (level entry)           Wheelchair Mobility     Tilt Bed    Modified Rankin (Stroke Patients Only)       Balance                                            Communication    Cognition Arousal: Alert Behavior During Therapy: WFL for tasks assessed/performed   PT - Cognitive impairments: No apparent impairments                       PT - Cognition Comments: AxO x 3 pleasant and motivated.  Knowledgable as he had his other knee replaced last Decemeber. Following commands: Intact      Cueing Cueing Techniques: Verbal cues  Exercises   Total Knee Replacement TE's following HEP handout 10 reps  B LE ankle pumps 05 reps towel squeezes 05 reps knee presses 05 reps heel slides  05 reps SAQ's 05 reps SLR's 05 reps ABD Educated on use of gait belt to assist with TE's Followed by ICE    General Comments        Pertinent Vitals/Pain Pain Assessment Pain Assessment: 0-10 Pain Score: 5  Pain Location: L knee with amb 5/10 Pain Descriptors / Indicators: Operative site guarding, Sore, Tender, Tightness Pain Intervention(s): Monitored during session, Premedicated before session, Repositioned, Ice applied    Home Living                          Prior Function             PT Goals (current goals can now be found in the care plan section) Progress towards PT goals: Progressing toward goals    Frequency    7X/week      PT Plan      Co-evaluation              AM-PAC PT 6 Clicks Mobility   Outcome Measure  Help needed turning from your back to your side while in a flat bed without using bedrails?: None Help needed moving from lying on your back to sitting on the side of a flat bed without using bedrails?: None Help needed moving to and from a bed to a chair (including a wheelchair)?: None Help needed standing up from a chair using your arms (e.g., wheelchair or bedside chair)?: None Help needed to walk in hospital room?: None Help needed climbing 3-5 steps with a railing? : A Little 6 Click Score: 23    End of Session Equipment Utilized During Treatment: Gait belt Activity Tolerance: Patient tolerated treatment well Patient left: in chair;with call bell/phone within reach Nurse Communication: Mobility status PT Visit Diagnosis: Other abnormalities of gait and mobility (R26.89);Muscle weakness (generalized) (M62.81)     Time: 9097-9069 PT Time Calculation (min) (ACUTE ONLY): 28 min  Charges:    $Gait Training: 8-22 mins $Therapeutic Exercise: 8-22 mins PT General Charges $$ ACUTE PT VISIT: 1 Visit                    Katheryn Leap  PTA Acute  Rehabilitation Services Office M-F          [phone]

## 2024-08-13 NOTE — Progress Notes (Signed)
" ° °  Subjective: 1 Day Post-Op Procedures (LRB): ARTHROPLASTY, KNEE, TOTAL (Left) Patient reports pain as mild.   Patient seen in rounds with Dr. Ernie. Patient is well, and has had no acute complaints or problems. No acute events overnight. Foley catheter removed. Patient ambulated 50 feet with PT.  We will start therapy today.   Objective: Vital signs in last 24 hours: Temp:  [97.4 F (36.3 C)-98.7 F (37.1 C)] 98.5 F (36.9 C) (01/28 0531) Pulse Rate:  [54-82] 66 (01/28 0531) Resp:  [14-23] 18 (01/28 0531) BP: (96-137)/(62-102) 132/79 (01/28 0531) SpO2:  [94 %-100 %] 96 % (01/28 0531)  Intake/Output from previous day:  Intake/Output Summary (Last 24 hours) at 08/13/2024 0733 Last data filed at 08/13/2024 0600 Gross per 24 hour  Intake 3158.36 ml  Output 2105 ml  Net 1053.36 ml     Intake/Output this shift: No intake/output data recorded.  Labs: Recent Labs    08/13/24 0332  HGB 13.4   Recent Labs    08/13/24 0332  WBC 12.5*  RBC 4.47  HCT 39.8  PLT 245   Recent Labs    08/13/24 0332  NA 138  K 4.3  CL 106  CO2 21*  BUN 11  CREATININE 0.78  GLUCOSE 130*  CALCIUM 8.5*   No results for input(s): LABPT, INR in the last 72 hours.  Exam: General - Patient is Alert and Oriented Extremity - Neurologically intact Sensation intact distally Intact pulses distally Dorsiflexion/Plantar flexion intact Dressing - dressing C/D/I Motor Function - intact, moving foot and toes well on exam.   Past Medical History:  Diagnosis Date   Arthritis    Medical history non-contributory    Pre-diabetes     Assessment/Plan: 1 Day Post-Op Procedures (LRB): ARTHROPLASTY, KNEE, TOTAL (Left) Principal Problem:   S/P total knee arthroplasty, left  Estimated body mass index is 28.07 kg/m as calculated from the following:   Height as of this encounter: 6' (1.829 m).   Weight as of this encounter: 93.9 kg. Advance diet Up with therapy D/C IV fluids   Patient's  anticipated LOS is less than 2 midnights, meeting these requirements: - Younger than 72 - Lives within 1 hour of care - Has a competent adult at home to recover with post-op recover - NO history of  - Chronic pain requiring opiods  - Diabetes  - Coronary Artery Disease  - Heart failure  - Heart attack  - Stroke  - DVT/VTE  - Cardiac arrhythmia  - Respiratory Failure/COPD  - Renal failure  - Anemia  - Advanced Liver disease     DVT Prophylaxis - Aspirin  Weight bearing as tolerated.  Hgb stable at 13.4 this AM Will send home on a week of abx prophylactically as previously discussed  Plan is to go Home after hospital stay. Plan for discharge today following 1-2 sessions of PT as long as they are meeting their goals. Patient is scheduled for OPPT. Follow up in the office in 2 weeks.   Rosina Calin, PA-C Orthopedic Surgery [phone] 08/13/2024, 7:33 AM  "

## 2024-08-14 ENCOUNTER — Encounter (HOSPITAL_COMMUNITY): Payer: Self-pay | Admitting: Orthopedic Surgery

## 2024-08-14 NOTE — Anesthesia Postprocedure Evaluation (Signed)
"   Anesthesia Post Note  Patient: Mike Hodge.  Procedure(s) Performed: ARTHROPLASTY, KNEE, TOTAL (Left: Knee)     Patient location during evaluation: PACU Anesthesia Type: Regional, Spinal and MAC Level of consciousness: oriented and awake and alert Pain management: pain level controlled Vital Signs Assessment: post-procedure vital signs reviewed and stable Respiratory status: spontaneous breathing, respiratory function stable and patient connected to nasal cannula oxygen Cardiovascular status: blood pressure returned to baseline and stable Postop Assessment: no headache, no backache and no apparent nausea or vomiting Anesthetic complications: no   No notable events documented.  Last Vitals:  Vitals:   08/13/24 0531 08/13/24 0900  BP: 132/79 (!) 138/93  Pulse: 66 (!) 56  Resp: 18 17  Temp: 36.9 C 36.5 C  SpO2: 96% 98%    Last Pain:  Vitals:   08/13/24 0933  TempSrc:   PainSc: 2                  Fergus Throne S      "

## 2024-08-14 NOTE — Discharge Summary (Signed)
 Patient ID: Mike Hodge. MRN: [id number] DOB/AGE: [birth year] 59 y.o.  Admit date: 08/12/2024 Discharge date: 08/13/2024  Admission Diagnoses:  Left knee osteoarthritis  Discharge Diagnoses:  Principal Problem:   S/P total knee arthroplasty, left   Past Medical History:  Diagnosis Date   Arthritis    Medical history non-contributory    Pre-diabetes     Surgeries: Procedures: ARTHROPLASTY, KNEE, TOTAL on 08/12/2024   Consultants:   Discharged Condition: Improved  Hospital Course: Mike Conrad. is an 59 y.o. male who was admitted 08/12/2024 for operative treatment ofS/P total knee arthroplasty, left. Patient has severe unremitting pain that affects sleep, daily activities, and work/hobbies. After pre-op clearance the patient was taken to the operating room on 08/12/2024 and underwent  Procedures: ARTHROPLASTY, KNEE, TOTAL.    Patient was given perioperative antibiotics:  Anti-infectives (From admission, onward)    Start     Dose/Rate Route Frequency Ordered Stop   08/13/24 0000  cefadroxil  (DURICEF) 500 MG capsule        500 mg Oral 2 times daily 08/13/24 0735 08/20/24 2359   08/12/24 1615  ceFAZolin  (ANCEF ) IVPB 2g/100 mL premix        2 g 200 mL/hr over 30 Minutes Intravenous Every 6 hours 08/12/24 1517 08/13/24 0952   08/12/24 0715  ceFAZolin  (ANCEF ) IVPB 2g/100 mL premix        2 g 200 mL/hr over 30 Minutes Intravenous On call to O.R. 08/12/24 0714 08/12/24 1011        Patient was given sequential compression devices, early ambulation, and chemoprophylaxis to prevent DVT. Patient worked with PT and was meeting their goals regarding safe ambulation and transfers.  Patient benefited maximally from hospital stay and there were no complications.    Recent vital signs: No data found.   Recent laboratory studies:  Recent Labs    08/13/24 0332  WBC 12.5*  HGB 13.4  HCT 39.8  PLT 245  NA 138  K 4.3  CL 106  CO2 21*  BUN 11  CREATININE 0.78   GLUCOSE 130*  CALCIUM 8.5*     Discharge Medications:   Allergies as of 08/13/2024   No Known Allergies      Medication List     STOP taking these medications    diclofenac 75 MG EC tablet Commonly known as: VOLTAREN       TAKE these medications    acetaminophen  500 MG tablet Commonly known as: TYLENOL  Take 1,000 mg by mouth daily in the afternoon.   Aspirin  Low Dose 81 MG chewable tablet Generic drug: aspirin  Chew 1 tablet (81 mg total) by mouth 2 (two) times daily for 28 days.   cefadroxil  500 MG capsule Commonly known as: DURICEF Take 1 capsule (500 mg total) by mouth 2 (two) times daily for 7 days.   celecoxib  200 MG capsule Commonly known as: CELEBREX  Take 1 capsule (200 mg total) by mouth 2 (two) times daily.   methocarbamol  500 MG tablet Commonly known as: ROBAXIN  Take 1 tablet (500 mg total) by mouth every 6 (six) hours as needed for muscle spasms.   oxyCODONE  5 MG immediate release tablet Commonly known as: Oxy IR/ROXICODONE  Take 1 tablet (5 mg total) by mouth every 4 (four) hours as needed for severe pain (pain score 7-10).   polyethylene glycol powder 17 GM/SCOOP powder Commonly known as: GLYCOLAX /MIRALAX  Mix 17 g in 4 oz of liquid and take by mouth 2 (two) times daily.   senna 8.6 MG  Tabs tablet Commonly known as: SENOKOT Take 2 tablets (17.2 mg total) by mouth at bedtime for 14 days.               Discharge Care Instructions  (From admission, onward)           Start     Ordered   08/13/24 0000  Change dressing       Comments: Maintain surgical dressing until follow up in the clinic. If the edges start to pull up, may reinforce with tape. If the dressing is no longer working, may remove and cover with gauze and tape, but must keep the area dry and clean.  Call with any questions or concerns.   08/13/24 0735            Diagnostic Studies: No results found.  Disposition: Discharge disposition: 01-Home or Self  Care       Discharge Instructions     Call MD / Call 911   Complete by: As directed    If you experience chest pain or shortness of breath, CALL 911 and be transported to the hospital emergency room.  If you develope a fever above 101 F, pus (white drainage) or increased drainage or redness at the wound, or calf pain, call your surgeon's office.   Change dressing   Complete by: As directed    Maintain surgical dressing until follow up in the clinic. If the edges start to pull up, may reinforce with tape. If the dressing is no longer working, may remove and cover with gauze and tape, but must keep the area dry and clean.  Call with any questions or concerns.   Constipation Prevention   Complete by: As directed    Drink plenty of fluids.  Prune juice may be helpful.  You may use a stool softener, such as Colace (over the counter) 100 mg twice a day.  Use MiraLax  (over the counter) for constipation as needed.   Diet - low sodium heart healthy   Complete by: As directed    Increase activity slowly as tolerated   Complete by: As directed    Weight bearing as tolerated with assist device (walker, cane, etc) as directed, use it as long as suggested by your surgeon or therapist, typically at least 4-6 weeks.   Post-operative opioid taper instructions:   Complete by: As directed    POST-OPERATIVE OPIOID TAPER INSTRUCTIONS: It is important to wean off of your opioid medication as soon as possible. If you do not need pain medication after your surgery it is ok to stop day one. Opioids include: Codeine, Hydrocodone (Norco, Vicodin), Oxycodone (Percocet, oxycontin ) and hydromorphone  amongst others.  Long term and even short term use of opiods can cause: Increased pain response Dependence Constipation Depression Respiratory depression And more.  Withdrawal symptoms can include Flu like symptoms Nausea, vomiting And more Techniques to manage these symptoms Hydrate well Eat regular healthy  meals Stay active Use relaxation techniques(deep breathing, meditating, yoga) Do Not substitute Alcohol to help with tapering If you have been on opioids for less than two weeks and do not have pain than it is ok to stop all together.  Plan to wean off of opioids This plan should start within one week post op of your joint replacement. Maintain the same interval or time between taking each dose and first decrease the dose.  Cut the total daily intake of opioids by one tablet each day Next start to increase the time between doses. The last dose  that should be eliminated is the evening dose.      TED hose   Complete by: As directed    Use stockings (TED hose) for 2 weeks on both leg(s).  You may remove them at night for sleeping.        Follow-up Information     Ernie Cough, MD. Go on 08/25/2024.   Specialty: Orthopedic Surgery Why: You are scheduled for a post op appointment on Monday 08/25/24 at 8:00am Contact information: [address] [place] [address] [phone]                  Signed: Rosina JONELLE Hodge 08/14/2024, 10:56 AM

## 2024-08-15 ENCOUNTER — Ambulatory Visit: Attending: Orthopedic Surgery | Admitting: Physical Therapy

## 2024-08-15 ENCOUNTER — Other Ambulatory Visit: Payer: Self-pay

## 2024-08-15 DIAGNOSIS — M25562 Pain in left knee: Secondary | ICD-10-CM | POA: Insufficient documentation

## 2024-08-15 DIAGNOSIS — M25662 Stiffness of left knee, not elsewhere classified: Secondary | ICD-10-CM | POA: Insufficient documentation

## 2024-08-15 DIAGNOSIS — G8929 Other chronic pain: Secondary | ICD-10-CM | POA: Insufficient documentation

## 2024-08-15 NOTE — Therapy (Signed)
 " OUTPATIENT PHYSICAL THERAPY LOWER EXTREMITY EVALUATION   Patient Name: Mike Hodge. MRN: [id number] DOB:1966-02-13, 59 y.o., male Today's Date: 08/15/2024  END OF SESSION:  PT End of Session - 08/15/24 1058     Visit Number 1    Number of Visits 12    Date for Recertification  09/26/24    PT Start Time 0855    PT Stop Time 0942    PT Time Calculation (min) 47 min    Activity Tolerance Patient tolerated treatment well    Behavior During Therapy [facility] for tasks assessed/performed          Past Medical History:  Diagnosis Date   Arthritis    Medical history non-contributory    Pre-diabetes    Past Surgical History:  Procedure Laterality Date   ANTERIOR CERVICAL CORPECTOMY N/A 03/04/2021   Procedure: Cervical five Corpectomy;  Surgeon: Gillie Duncans, MD;  Location: [facility] OR;  Service: Neurosurgery;  Laterality: N/A;   TONSILLECTOMY     TOTAL KNEE ARTHROPLASTY Right 07/01/2024   Procedure: ARTHROPLASTY, KNEE, TOTAL;  Surgeon: Ernie Cough, MD;  Location: [facility] ORS;  Service: Orthopedics;  Laterality: Right;   TOTAL KNEE ARTHROPLASTY Left 08/12/2024   Procedure: ARTHROPLASTY, KNEE, TOTAL;  Surgeon: Ernie Cough, MD;  Location: [facility] ORS;  Service: Orthopedics;  Laterality: Left;   Patient Active Problem List   Diagnosis Date Noted   S/P total knee arthroplasty, left 08/12/2024   S/P total knee arthroplasty, right 07/01/2024   Cervical stenosis of spinal canal 03/04/2021   REFERRING PROVIDER: Cough Ernie MD  REFERRING DIAG: Left total knee replacement.  THERAPY DIAG:  Chronic pain of left knee  Stiffness of left knee, not elsewhere classified  Rationale for Evaluation and Treatment: Rehabilitation  ONSET DATE: DOS:  08/12/24.  SUBJECTIVE:   SUBJECTIVE STATEMENT:  The patient presents to the clinic s/p left total knee replacement performed on 08/12/24.  He reports no pain at rest but much higher with movement.  He is compliant with his HEP and well versed  with  it as he had is right knee replaced on 07/01/24 and had OPPT.  He is walking safely with a FWW.   PERTINENT HISTORY: Right TKA (07/01/24),   PAIN:  Are you having pain? See above.    PRECAUTIONS: Other: No ultrasound   RED FLAGS: None   WEIGHT BEARING RESTRICTIONS: No  FALLS:  Has patient fallen in last 6 months? No  LIVING ENVIRONMENT: Lives in: House/apartment Has following equipment at home: FWW.   PLOF: Independent  PATIENT GOALS: Do more without knee pain.  OBJECTIVE:   PATIENT SURVEYS:  LEFS:  21/80.    EDEMA:  Circumferential: Equal.    PALPATION: Aquacel intact and he has his knee ACE wrapped.  Currently c/o some diffuse anterior knee right knee tenderness.    LOWER EXTREMITY ROM:  In supine:  Left knee extension is -7 degrees and flexion to 85 degrees.  LOWER EXTREMITY MMT:  Currently unable to perform a left antigravity SAQ and SLR.  GAIT: Safe gait pattern with a FWW with a decrease in step and stride length.  TREATMENT DATE: 08/15/24:  Nustep x 10 minutes for left knee range of motion and muscle activation f/b LE elevation and vasopneumatic on low x 15 minutes.      PATIENT EDUCATION:  Education details:  Person educated:  International aid/development worker:  Education comprehension:   HOME EXERCISE PROGRAM:   ASSESSMENT:  CLINICAL IMPRESSION: The patient presents to OPPT s/p left TKA performed on 08/12/24.  He is compliant to a HEP.  He is walking safely with a FWW.  His Aquacel is intact.  He is currently unable to perform an antigravity SAQ and SLR.  He lacks some flexion and extension but overall is doing well.  His LEFS score is 21/80. Patient will benefit from skilled PT intervention to address pain and deficits.     OBJECTIVE IMPAIRMENTS: Abnormal gait, decreased activity tolerance, decreased mobility, decreased ROM,  decreased strength, increased edema, and pain.   ACTIVITY LIMITATIONS: carrying, lifting, bending, standing, transfers, bed mobility, and locomotion level  PARTICIPATION LIMITATIONS: meal prep, cleaning, laundry, shopping, community activity, and yard work  PERSONAL FACTORS: Time since onset of injury/illness/exacerbation are also affecting patient's functional outcome.   REHAB POTENTIAL: Good  CLINICAL DECISION MAKING: Stable/uncomplicated  EVALUATION COMPLEXITY: Low   GOALS:   SHORT TERM GOALS: Target date: 08/29/24  Ind with a HEP. Goal status: INITIAL  2.  Full active left knee extension.  Goal status: INITIAL   LONG TERM GOALS: Target date: 09/26/24  Ind with an advanced HEP.  Goal status: INITIAL  2.  Active left knee flexion to 115 degrees+ so the patient can perform functional tasks and do so with pain not > 2-3/10.  Goal status: INITIAL  3.  Increase left hip and knee strength to a solid 4+/5 to provide good stability for accomplishment of functional activities.  Goal status: INITIAL  4.  Perform ADL's with pain not > 3/10.  Goal status: INITIAL  5.  Perform a reciprocating stair gait with one railing with pain not > 2-3/10.  Goal status: INITIAL  6.  Improve LEFS score by at least 25 points.  Goal status: INITIAL   PLAN:  PT FREQUENCY/DURATION:  12 visits.  PLANNED INTERVENTIONS: 97110-Therapeutic exercises, 97530- Therapeutic activity, [id number]- Neuromuscular re-education, 97535- Self Care, [id number]- Manual therapy, [id number]- Electrical stimulation (unattended), 97016- Vasopneumatic device, Patient/Family education, and Cryotherapy  PLAN FOR NEXT SESSION: Nustep.  Progress per TKA protocol.  Vasopneumatic.     Donavan Kerlin, PT 08/15/2024, 11:35 AM  "

## 2024-08-18 ENCOUNTER — Ambulatory Visit: Admitting: Physical Therapy

## 2024-08-21 ENCOUNTER — Ambulatory Visit: Admitting: Physical Therapy

## 2024-08-21 NOTE — Therapy (Unsigned)
 " OUTPATIENT PHYSICAL THERAPY LOWER EXTREMITY EVALUATION   Patient Name: Mike Hodge. MRN: [id number] DOB:Aug 14, 1965, 59 y.o., male Today's Date: 08/21/2024  END OF SESSION:    Past Medical History:  Diagnosis Date   Arthritis    Medical history non-contributory    Pre-diabetes    Past Surgical History:  Procedure Laterality Date   ANTERIOR CERVICAL CORPECTOMY N/A 03/04/2021   Procedure: Cervical five Corpectomy;  Surgeon: Gillie Duncans, MD;  Location: [facility];  Service: Neurosurgery;  Laterality: N/A;   TONSILLECTOMY     TOTAL KNEE ARTHROPLASTY Right 07/01/2024   Procedure: ARTHROPLASTY, KNEE, TOTAL;  Surgeon: Ernie Cough, MD;  Location: [facility];  Service: Orthopedics;  Laterality: Right;   TOTAL KNEE ARTHROPLASTY Left 08/12/2024   Procedure: ARTHROPLASTY, KNEE, TOTAL;  Surgeon: Ernie Cough, MD;  Location: [facility];  Service: Orthopedics;  Laterality: Left;   Patient Active Problem List   Diagnosis Date Noted   S/P total knee arthroplasty, left 08/12/2024   S/P total knee arthroplasty, right 07/01/2024   Cervical stenosis of spinal canal 03/04/2021   REFERRING PROVIDER: Cough Ernie MD  REFERRING DIAG: Left total knee replacement.  THERAPY DIAG:  No diagnosis found.  Rationale for Evaluation and Treatment: Rehabilitation  ONSET DATE: DOS:  08/12/24.  SUBJECTIVE:   SUBJECTIVE STATEMENT: *** The patient presents to the clinic s/p left total knee replacement performed on 08/12/24.  He reports no pain at rest but much higher with movement.  He is compliant with his HEP and well versed  with it as he had is right knee replaced on 07/01/24 and had OPPT.  He is walking safely with a FWW.   PERTINENT HISTORY: Right TKA (07/01/24),   PAIN:  Are you having pain? See above.    PRECAUTIONS: Other: No ultrasound   RED FLAGS: None   WEIGHT BEARING RESTRICTIONS: No  FALLS:  Has patient fallen in last 6 months? No  LIVING ENVIRONMENT: Lives in:  House/apartment Has following equipment at home: FWW.   PLOF: Independent  PATIENT GOALS: Do more without knee pain.  OBJECTIVE:   PATIENT SURVEYS:  LEFS:  21/80.    EDEMA:  Circumferential: Equal.    PALPATION: Aquacel intact and he has his knee ACE wrapped.  Currently c/o some diffuse anterior knee right knee tenderness.    LOWER EXTREMITY ROM:  In supine:  Left knee extension is -7 degrees and flexion to 85 degrees.  LOWER EXTREMITY MMT:  Currently unable to perform a left antigravity SAQ and SLR.  GAIT: Safe gait pattern with a FWW with a decrease in step and stride length.                                                                                                                                  TREATMENT DATE:  08/21/24 ***  08/15/24:  Nustep x 10 minutes for left knee range of motion and muscle activation f/b LE elevation and  vasopneumatic on low x 15 minutes.      PATIENT EDUCATION:  Education details:  Person educated:  International aid/development worker:  Education comprehension:   HOME EXERCISE PROGRAM:   ASSESSMENT:  CLINICAL IMPRESSION: *** The patient presents to OPPT s/p left TKA performed on 08/12/24.  He is compliant to a HEP.  He is walking safely with a FWW.  His Aquacel is intact.  He is currently unable to perform an antigravity SAQ and SLR.  He lacks some flexion and extension but overall is doing well.  His LEFS score is 21/80. Patient will benefit from skilled PT intervention to address pain and deficits.     OBJECTIVE IMPAIRMENTS: Abnormal gait, decreased activity tolerance, decreased mobility, decreased ROM, decreased strength, increased edema, and pain.   ACTIVITY LIMITATIONS: carrying, lifting, bending, standing, transfers, bed mobility, and locomotion level  PARTICIPATION LIMITATIONS: meal prep, cleaning, laundry, shopping, community activity, and yard work  PERSONAL FACTORS: Time since onset of injury/illness/exacerbation are also affecting  patient's functional outcome.   REHAB POTENTIAL: Good  CLINICAL DECISION MAKING: Stable/uncomplicated  EVALUATION COMPLEXITY: Low   GOALS:   SHORT TERM GOALS: Target date: 08/29/24  Ind with a HEP. Goal status: INITIAL  2.  Full active left knee extension.  Goal status: INITIAL   LONG TERM GOALS: Target date: 09/26/24  Ind with an advanced HEP.  Goal status: INITIAL  2.  Active left knee flexion to 115 degrees+ so the patient can perform functional tasks and do so with pain not > 2-3/10.  Goal status: INITIAL  3.  Increase left hip and knee strength to a solid 4+/5 to provide good stability for accomplishment of functional activities.  Goal status: INITIAL  4.  Perform ADL's with pain not > 3/10.  Goal status: INITIAL  5.  Perform a reciprocating stair gait with one railing with pain not > 2-3/10.  Goal status: INITIAL  6.  Improve LEFS score by at least 25 points.  Goal status: INITIAL   PLAN:  PT FREQUENCY/DURATION:  12 visits.  PLANNED INTERVENTIONS: 97110-Therapeutic exercises, 97530- Therapeutic activity, [id number]- Neuromuscular re-education, 97535- Self Care, [id number]- Manual therapy, [id number]- Electrical stimulation (unattended), 97016- Vasopneumatic device, Patient/Family education, and Cryotherapy  PLAN FOR NEXT SESSION: Nustep.  Progress per TKA protocol.  Vasopneumatic.     Moksha Dorgan April Ma L Makyra Corprew, PT 08/21/2024, 7:50 AM  "

## 2024-08-22 ENCOUNTER — Ambulatory Visit: Attending: Orthopedic Surgery

## 2024-08-22 DIAGNOSIS — M25662 Stiffness of left knee, not elsewhere classified: Secondary | ICD-10-CM

## 2024-08-22 DIAGNOSIS — G8929 Other chronic pain: Secondary | ICD-10-CM

## 2024-08-22 NOTE — Therapy (Signed)
 " OUTPATIENT PHYSICAL THERAPY LOWER EXTREMITY TREATMENT   Patient Name: Mike Hodge. MRN: [id number] DOB:1966/01/04, 59 y.o., male Today's Date: 08/22/2024  END OF SESSION:  PT End of Session - 08/22/24 0803     Visit Number 2    Number of Visits 12    Date for Recertification  09/26/24    PT Start Time 0801    PT Stop Time 0900    PT Time Calculation (min) 59 min    Activity Tolerance Patient tolerated treatment well    Behavior During Therapy [facility] for tasks assessed/performed           Past Medical History:  Diagnosis Date   Arthritis    Medical history non-contributory    Pre-diabetes    Past Surgical History:  Procedure Laterality Date   ANTERIOR CERVICAL CORPECTOMY N/A 03/04/2021   Procedure: Cervical five Corpectomy;  Surgeon: Gillie Duncans, MD;  Location: [facility];  Service: Neurosurgery;  Laterality: N/A;   TONSILLECTOMY     TOTAL KNEE ARTHROPLASTY Right 07/01/2024   Procedure: ARTHROPLASTY, KNEE, TOTAL;  Surgeon: Ernie Cough, MD;  Location: [facility];  Service: Orthopedics;  Laterality: Right;   TOTAL KNEE ARTHROPLASTY Left 08/12/2024   Procedure: ARTHROPLASTY, KNEE, TOTAL;  Surgeon: Ernie Cough, MD;  Location: [facility];  Service: Orthopedics;  Laterality: Left;   Patient Active Problem List   Diagnosis Date Noted   S/P total knee arthroplasty, left 08/12/2024   S/P total knee arthroplasty, right 07/01/2024   Cervical stenosis of spinal canal 03/04/2021   REFERRING PROVIDER: Cough Ernie MD  REFERRING DIAG: Left total knee replacement.  THERAPY DIAG:  Chronic pain of left knee  Stiffness of left knee, not elsewhere classified  Rationale for Evaluation and Treatment: Rehabilitation  ONSET DATE: DOS:  08/12/24.  SUBJECTIVE:   SUBJECTIVE STATEMENT:  Pt reports 6/10 left knee pain today.  PERTINENT HISTORY: Right TKA (07/01/24),   PAIN:  Are you having pain? Yes: NPRS scale: 6/10 Pain location: left knee  PRECAUTIONS: Other: No ultrasound    RED FLAGS: None   WEIGHT BEARING RESTRICTIONS: No  FALLS:  Has patient fallen in last 6 months? No  LIVING ENVIRONMENT: Lives in: House/apartment Has following equipment at home: FWW.   PLOF: Independent  PATIENT GOALS: Do more without knee pain.  OBJECTIVE:   PATIENT SURVEYS:  LEFS:  21/80.    EDEMA:  Circumferential: Equal.    PALPATION: Aquacel intact and he has his knee ACE wrapped.  Currently c/o some diffuse anterior knee right knee tenderness.    LOWER EXTREMITY ROM:  In supine:  Left knee extension is -7 degrees and flexion to 85 degrees.  LOWER EXTREMITY MMT:  Currently unable to perform a left antigravity SAQ and SLR.  GAIT: Safe gait pattern with a FWW with a decrease in step and stride length.  TREATMENT DATE:   08/22/24   EXERCISE LOG  Exercise Repetitions and Resistance Comments  Nustep Lvl 3; seat 10-9; x 15 mins   Rockerboard 3 mins   Lunges 8 box x 3 mins   Forward Step Ups 20 reps    LAQs 2# x 25 reps bil   Seated Marches 2# x 25 reps bil   Seated Hip Abduction 3 mins   Seated Hip Adduction Red x 3 mins   Seated Ham Curls Red x 25 reps bil   STS     Blank cell = exercise not performed today   08/15/24:  Nustep x 10 minutes for left knee range of motion and muscle activation f/b LE elevation and vasopneumatic on low x 15 minutes.      PATIENT EDUCATION:  Education details:  Person educated:  International aid/development worker:  Education comprehension:   HOME EXERCISE PROGRAM:   ASSESSMENT:  CLINICAL IMPRESSION: Pt arrives for today's treatment session reporting 6/10 left knee pain.  Pt able to progress to seat 9 today on the Nustep with good results.  Pt instructed in standing and seated exercises to increase ROM, strength, and function.  Pt requiring min cues for proper technique and posture with newly added  exercises.  Normal responses to vaso noted upon removal.  Pt reported decreased pain at completion of today's treatment session.    OBJECTIVE IMPAIRMENTS: Abnormal gait, decreased activity tolerance, decreased mobility, decreased ROM, decreased strength, increased edema, and pain.   ACTIVITY LIMITATIONS: carrying, lifting, bending, standing, transfers, bed mobility, and locomotion level  PARTICIPATION LIMITATIONS: meal prep, cleaning, laundry, shopping, community activity, and yard work  PERSONAL FACTORS: Time since onset of injury/illness/exacerbation are also affecting patient's functional outcome.   REHAB POTENTIAL: Good  CLINICAL DECISION MAKING: Stable/uncomplicated  EVALUATION COMPLEXITY: Low   GOALS:   SHORT TERM GOALS: Target date: 08/29/24  Ind with a HEP. Goal status: INITIAL  2.  Full active left knee extension.  Goal status: INITIAL   LONG TERM GOALS: Target date: 09/26/24  Ind with an advanced HEP.  Goal status: INITIAL  2.  Active left knee flexion to 115 degrees+ so the patient can perform functional tasks and do so with pain not > 2-3/10.  Goal status: INITIAL  3.  Increase left hip and knee strength to a solid 4+/5 to provide good stability for accomplishment of functional activities.  Goal status: INITIAL  4.  Perform ADL's with pain not > 3/10.  Goal status: INITIAL  5.  Perform a reciprocating stair gait with one railing with pain not > 2-3/10.  Goal status: INITIAL  6.  Improve LEFS score by at least 25 points.  Goal status: INITIAL   PLAN:  PT FREQUENCY/DURATION:  12 visits.  PLANNED INTERVENTIONS: 97110-Therapeutic exercises, 97530- Therapeutic activity, [id number]- Neuromuscular re-education, 97535- Self Care, 02859- Manual therapy, G0283- Electrical stimulation (unattended), 97016- Vasopneumatic device, Patient/Family education, and Cryotherapy  PLAN FOR NEXT SESSION: Nustep.  Progress per TKA protocol.  Vasopneumatic.     Delon DELENA Gosling, PTA 08/22/2024, 9:38 AM  "

## 2024-08-26 ENCOUNTER — Ambulatory Visit: Admitting: Physical Therapy

## 2024-08-28 ENCOUNTER — Ambulatory Visit: Admitting: Physical Therapy
